# Patient Record
Sex: Female | Born: 1950
Health system: Southern US, Community
[De-identification: ages and names within clinical notes are randomized; demographics above are authoritative.]

## PROBLEM LIST (undated history)

## (undated) DIAGNOSIS — M4800 Spinal stenosis, site unspecified: Secondary | ICD-10-CM

## (undated) DIAGNOSIS — F419 Anxiety disorder, unspecified: Secondary | ICD-10-CM

## (undated) DIAGNOSIS — F4310 Post-traumatic stress disorder, unspecified: Secondary | ICD-10-CM

## (undated) DIAGNOSIS — Z9889 Other specified postprocedural states: Secondary | ICD-10-CM

## (undated) DIAGNOSIS — F909 Attention-deficit hyperactivity disorder, unspecified type: Secondary | ICD-10-CM

## (undated) DIAGNOSIS — F329 Major depressive disorder, single episode, unspecified: Secondary | ICD-10-CM

## (undated) DIAGNOSIS — E7800 Pure hypercholesterolemia, unspecified: Secondary | ICD-10-CM

## (undated) DIAGNOSIS — M1990 Unspecified osteoarthritis, unspecified site: Secondary | ICD-10-CM

## (undated) DIAGNOSIS — R112 Nausea with vomiting, unspecified: Secondary | ICD-10-CM

## (undated) DIAGNOSIS — F32A Depression, unspecified: Secondary | ICD-10-CM

## (undated) DIAGNOSIS — R7303 Prediabetes: Secondary | ICD-10-CM

## (undated) DIAGNOSIS — M797 Fibromyalgia: Secondary | ICD-10-CM

## (undated) HISTORY — PX: TONSILLECTOMY: SUR1361

## (undated) HISTORY — PX: CARPAL TUNNEL RELEASE: SHX101

## (undated) HISTORY — PX: PAROTIDECTOMY: SUR1003

## (undated) HISTORY — DX: Attention-deficit hyperactivity disorder, unspecified type: F90.9

## (undated) HISTORY — DX: Anxiety disorder, unspecified: F41.9

## (undated) HISTORY — DX: Depression, unspecified: F32.A

## (undated) HISTORY — PX: BACK SURGERY: SHX140

---
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
## 1993-05-31 HISTORY — PX: BREAST EXCISIONAL BIOPSY: SUR124

## 2001-03-17 ENCOUNTER — Other Ambulatory Visit: Admission: RE | Admit: 2001-03-17 | Discharge: 2001-03-17 | Payer: Self-pay | Admitting: *Deleted

## 2002-04-23 ENCOUNTER — Other Ambulatory Visit: Admission: RE | Admit: 2002-04-23 | Discharge: 2002-04-23 | Payer: Self-pay | Admitting: Obstetrics and Gynecology

## 2002-06-18 ENCOUNTER — Ambulatory Visit (HOSPITAL_COMMUNITY): Admission: RE | Admit: 2002-06-18 | Discharge: 2002-06-18 | Payer: Self-pay | Admitting: Pulmonary Disease

## 2003-01-20 ENCOUNTER — Ambulatory Visit: Admission: RE | Admit: 2003-01-20 | Discharge: 2003-01-20 | Payer: Self-pay | Admitting: Pulmonary Disease

## 2003-06-01 HISTORY — PX: COLONOSCOPY: SHX174

## 2003-12-24 ENCOUNTER — Encounter: Admission: RE | Admit: 2003-12-24 | Discharge: 2003-12-24 | Payer: Self-pay | Admitting: Obstetrics and Gynecology

## 2005-02-15 ENCOUNTER — Encounter: Admission: RE | Admit: 2005-02-15 | Discharge: 2005-02-15 | Payer: Self-pay | Admitting: Obstetrics and Gynecology

## 2005-08-11 ENCOUNTER — Encounter (INDEPENDENT_AMBULATORY_CARE_PROVIDER_SITE_OTHER): Payer: Self-pay | Admitting: Specialist

## 2005-08-11 ENCOUNTER — Ambulatory Visit (HOSPITAL_COMMUNITY): Admission: RE | Admit: 2005-08-11 | Discharge: 2005-08-11 | Payer: Self-pay | Admitting: *Deleted

## 2006-12-07 ENCOUNTER — Encounter: Admission: RE | Admit: 2006-12-07 | Discharge: 2006-12-07 | Payer: Self-pay | Admitting: Obstetrics and Gynecology

## 2008-08-27 ENCOUNTER — Encounter: Admission: RE | Admit: 2008-08-27 | Discharge: 2008-08-27 | Payer: Self-pay | Admitting: Obstetrics and Gynecology

## 2009-11-13 ENCOUNTER — Encounter: Admission: RE | Admit: 2009-11-13 | Discharge: 2009-11-13 | Payer: Self-pay | Admitting: Obstetrics and Gynecology

## 2010-10-16 NOTE — Op Note (Signed)
NAME:  ABIR, EROH NO.:  [id number]   MEDICAL RECORD NO.:  [id number]          PATIENT TYPE:  AMB   LOCATION:  ENDO                         FACILITY:  [facility]   PHYSICIAN:  Anselmo Rod, M.D.  DATE OF BIRTH:  05/15/1951   DATE OF PROCEDURE:  08/11/2005  DATE OF DISCHARGE:                                 OPERATIVE REPORT   PROCEDURE:  Colonoscopy with cold biopsies x6, snare polypectomy x3.   ENDOSCOPIST:  Anselmo Rod, M.D.   INSTRUMENT USED:  Olympus videocolonoscope.   INDICATIONS FOR PROCEDURE:  The patient is a 59 year old white female with a  history of rectal bleeding undergoing screening colonoscopy to rule out  colonic polyps, masses, etc.   PREPROCEDURE PREPARATION:  Informed consent was procured from the patient.  The patient was fasted for four hours prior to the procedure and prepped  with OsmoPrep pill the night of and the morning of the procedure.  The risks  and benefits of the procedure, including a 10% missed rate of cancer and  polyps, was discussed with the patient as well.   PREPROCEDURE PHYSICAL EXAMINATION:  VITAL SIGNS:  Stable.  NECK:  Supple.  CHEST:  Clear to auscultation.  S1 and S2 regular.  ABDOMEN:  Soft with normal bowel sounds.   DESCRIPTION OF PROCEDURE:  The patient was placed in the left lateral  decubitus position and sedated with 100 mcg of Fentanyl, 10 mg of Versed,  and received 12.5 mg of Phenergan.  Once the patient was adequately sedated  and maintained on low-flow oxygen and continuous cardiac monitoring, the  Olympus videocolonoscope was advanced from the rectum to the cecum and  terminal ileum.  Multiple rectal and rectosigmoid polyps were removed by  cold biopsies (x6).  Three of the polyps were snared (hot snare).  There was  evidence of sigmoid diverticulosis.  The transverse colon, right colon,  cecum, and terminal ileum appeared healthy and without lesions.   IMPRESSION:  1.  Multiple rectal and  rectosigmoid polyps removed both by snare      polypectomy and cold biopsies.  2.  Sigmoid diverticulosis.  3.  Normal transverse colon, right colon, cecum, and terminal ileum.      Appendiceal orifice and ileocecal valve were clearly visualized and      photographed.   RECOMMENDATIONS:  1.  Await pathology results.  2.  Repeat colonoscopy depending on pathology results.  3.  Avoid all nonsteroidals, including aspirin, for the next two weeks.  4.  Outpatient followup as need arises in the future.  5.  Brochures on diverticulosis and a high fiber diet have been given to the      patient for education.      Anselmo Rod, M.D.  Electronically Signed     JNM/MEDQ  D:  08/11/2005  T:  08/12/2005  Job:  [id number]   cc:   Sherry A. Rosalio Macadamia, M.D.  Fax: [phone]   Oneal Deputy. Juanetta Gosling, M.D.  Fax: [phone]

## 2010-10-16 NOTE — Procedures (Signed)
   NAME:  [name], [name] NO.:  [id number]   MEDICAL RECORD NO.:  [id number]                    PATIENT TYPE:   LOCATION:                                       FACILITY:   PHYSICIAN:  Edward L. Juanetta Gosling, M.D.             DATE OF BIRTH:   DATE OF PROCEDURE:  DATE OF DISCHARGE:                                    STRESS TEST   INDICATIONS FOR PROCEDURE:  The patient is undergoing graded exercise  testing because of chest pain.  She also has hyperlipidemia and a positive  family history.  There are no contraindications to graded exercise testing.   The patient exercised for six minutes on the Bruce protocol reaching and  sustaining 7.0 METS.  Her maximum recorded heart rate was 147 which is 88%  of her age predicted maximal heart rate.  She had no chest pain during  exercise and no other symptoms.  Blood pressure response to exercise was  normal.  There were no electrocardiographic changes suggestive of inducible  ischemia.   IMPRESSION:  1. Fair exercise tolerance.  2. Normal blood pressure response to exercise.  3. No evidence of inducible ischemia.  4. No symptoms during exercise.                                               Edward L. Juanetta Gosling, M.D.    ELH/MEDQ  D:  06/18/2002  T:  06/18/2002  Job:  [id number]

## 2012-06-02 ENCOUNTER — Other Ambulatory Visit: Payer: Self-pay | Admitting: Obstetrics and Gynecology

## 2012-06-02 DIAGNOSIS — Z1231 Encounter for screening mammogram for malignant neoplasm of breast: Secondary | ICD-10-CM

## 2012-06-30 ENCOUNTER — Ambulatory Visit
Admission: RE | Admit: 2012-06-30 | Discharge: 2012-06-30 | Disposition: A | Discharge status: Home / Self Care | Payer: Medicare Other | Source: Ambulatory Visit | Attending: Obstetrics and Gynecology | Admitting: Obstetrics and Gynecology

## 2012-06-30 DIAGNOSIS — Z1231 Encounter for screening mammogram for malignant neoplasm of breast: Secondary | ICD-10-CM

## 2012-09-21 ENCOUNTER — Other Ambulatory Visit (HOSPITAL_COMMUNITY): Payer: Self-pay | Admitting: Pulmonary Disease

## 2012-09-21 DIAGNOSIS — Z139 Encounter for screening, unspecified: Secondary | ICD-10-CM

## 2012-09-26 ENCOUNTER — Ambulatory Visit (HOSPITAL_COMMUNITY)
Admission: RE | Admit: 2012-09-26 | Discharge: 2012-09-26 | Disposition: A | Discharge status: Home / Self Care | Payer: Medicare Other | Source: Ambulatory Visit | Attending: Pulmonary Disease | Admitting: Pulmonary Disease

## 2012-09-26 DIAGNOSIS — Z13820 Encounter for screening for osteoporosis: Secondary | ICD-10-CM | POA: Insufficient documentation

## 2012-09-26 DIAGNOSIS — Z139 Encounter for screening, unspecified: Secondary | ICD-10-CM

## 2012-09-26 DIAGNOSIS — Z780 Asymptomatic menopausal state: Secondary | ICD-10-CM | POA: Insufficient documentation

## 2013-06-26 ENCOUNTER — Other Ambulatory Visit: Payer: Self-pay | Admitting: Orthopedic Surgery

## 2013-06-26 DIAGNOSIS — M545 Low back pain, unspecified: Secondary | ICD-10-CM

## 2013-07-04 ENCOUNTER — Ambulatory Visit
Admission: RE | Admit: 2013-07-04 | Discharge: 2013-07-04 | Disposition: A | Discharge status: Home / Self Care | Payer: Medicare Other | Source: Ambulatory Visit | Attending: Orthopedic Surgery | Admitting: Orthopedic Surgery

## 2013-07-04 DIAGNOSIS — M545 Low back pain, unspecified: Secondary | ICD-10-CM

## 2013-09-06 ENCOUNTER — Other Ambulatory Visit: Payer: Self-pay

## 2013-09-06 DIAGNOSIS — Z1231 Encounter for screening mammogram for malignant neoplasm of breast: Secondary | ICD-10-CM

## 2013-09-06 DIAGNOSIS — Z9889 Other specified postprocedural states: Secondary | ICD-10-CM

## 2013-09-19 ENCOUNTER — Encounter (INDEPENDENT_AMBULATORY_CARE_PROVIDER_SITE_OTHER): Payer: Self-pay

## 2013-09-19 ENCOUNTER — Other Ambulatory Visit: Payer: Self-pay

## 2013-09-19 ENCOUNTER — Ambulatory Visit
Admission: RE | Admit: 2013-09-19 | Discharge: 2013-09-19 | Disposition: A | Discharge status: Home / Self Care | Payer: Medicare Other | Source: Ambulatory Visit

## 2013-09-19 DIAGNOSIS — Z9889 Other specified postprocedural states: Secondary | ICD-10-CM

## 2013-09-19 DIAGNOSIS — Z1231 Encounter for screening mammogram for malignant neoplasm of breast: Secondary | ICD-10-CM

## 2013-09-21 ENCOUNTER — Other Ambulatory Visit: Payer: Self-pay | Admitting: Obstetrics and Gynecology

## 2013-09-21 DIAGNOSIS — R928 Other abnormal and inconclusive findings on diagnostic imaging of breast: Secondary | ICD-10-CM

## 2013-10-03 ENCOUNTER — Ambulatory Visit
Admission: RE | Admit: 2013-10-03 | Discharge: 2013-10-03 | Disposition: A | Discharge status: Home / Self Care | Payer: Medicare Other | Source: Ambulatory Visit | Attending: Obstetrics and Gynecology | Admitting: Obstetrics and Gynecology

## 2013-10-03 DIAGNOSIS — R928 Other abnormal and inconclusive findings on diagnostic imaging of breast: Secondary | ICD-10-CM

## 2014-11-18 ENCOUNTER — Other Ambulatory Visit: Payer: Self-pay

## 2014-11-18 DIAGNOSIS — Z1231 Encounter for screening mammogram for malignant neoplasm of breast: Secondary | ICD-10-CM

## 2014-12-18 ENCOUNTER — Ambulatory Visit
Admission: RE | Admit: 2014-12-18 | Discharge: 2014-12-18 | Disposition: A | Discharge status: Home / Self Care | Payer: Medicare Other | Source: Ambulatory Visit

## 2014-12-18 DIAGNOSIS — Z1231 Encounter for screening mammogram for malignant neoplasm of breast: Secondary | ICD-10-CM

## 2015-04-30 ENCOUNTER — Other Ambulatory Visit: Payer: Self-pay | Admitting: Physician Assistant

## 2015-06-04 DIAGNOSIS — R7309 Other abnormal glucose: Secondary | ICD-10-CM | POA: Diagnosis not present

## 2015-06-04 DIAGNOSIS — D72829 Elevated white blood cell count, unspecified: Secondary | ICD-10-CM | POA: Diagnosis not present

## 2015-06-04 DIAGNOSIS — R739 Hyperglycemia, unspecified: Secondary | ICD-10-CM | POA: Diagnosis not present

## 2015-06-04 DIAGNOSIS — E785 Hyperlipidemia, unspecified: Secondary | ICD-10-CM | POA: Diagnosis not present

## 2015-06-04 DIAGNOSIS — Z0000 Encounter for general adult medical examination without abnormal findings: Secondary | ICD-10-CM | POA: Diagnosis not present

## 2015-06-05 DIAGNOSIS — Z0000 Encounter for general adult medical examination without abnormal findings: Secondary | ICD-10-CM | POA: Diagnosis not present

## 2015-06-05 DIAGNOSIS — Z23 Encounter for immunization: Secondary | ICD-10-CM | POA: Diagnosis not present

## 2015-06-12 DIAGNOSIS — B079 Viral wart, unspecified: Secondary | ICD-10-CM | POA: Diagnosis not present

## 2015-06-16 DIAGNOSIS — R311 Benign essential microscopic hematuria: Secondary | ICD-10-CM | POA: Diagnosis not present

## 2015-06-16 DIAGNOSIS — N393 Stress incontinence (female) (male): Secondary | ICD-10-CM | POA: Diagnosis not present

## 2015-07-18 DIAGNOSIS — Z1211 Encounter for screening for malignant neoplasm of colon: Secondary | ICD-10-CM | POA: Diagnosis not present

## 2016-01-23 ENCOUNTER — Other Ambulatory Visit: Payer: Self-pay | Admitting: Obstetrics and Gynecology

## 2016-01-23 DIAGNOSIS — Z1231 Encounter for screening mammogram for malignant neoplasm of breast: Secondary | ICD-10-CM

## 2016-01-26 DIAGNOSIS — Z01419 Encounter for gynecological examination (general) (routine) without abnormal findings: Secondary | ICD-10-CM | POA: Diagnosis not present

## 2016-01-26 DIAGNOSIS — Z1212 Encounter for screening for malignant neoplasm of rectum: Secondary | ICD-10-CM | POA: Diagnosis not present

## 2016-01-27 DIAGNOSIS — E785 Hyperlipidemia, unspecified: Secondary | ICD-10-CM | POA: Diagnosis not present

## 2016-01-27 DIAGNOSIS — R739 Hyperglycemia, unspecified: Secondary | ICD-10-CM | POA: Diagnosis not present

## 2016-01-27 DIAGNOSIS — R7309 Other abnormal glucose: Secondary | ICD-10-CM | POA: Diagnosis not present

## 2016-01-29 DIAGNOSIS — M545 Low back pain: Secondary | ICD-10-CM | POA: Diagnosis not present

## 2016-01-29 DIAGNOSIS — E669 Obesity, unspecified: Secondary | ICD-10-CM | POA: Diagnosis not present

## 2016-01-29 DIAGNOSIS — R69 Illness, unspecified: Secondary | ICD-10-CM | POA: Diagnosis not present

## 2016-01-29 DIAGNOSIS — E785 Hyperlipidemia, unspecified: Secondary | ICD-10-CM | POA: Diagnosis not present

## 2016-02-05 DIAGNOSIS — N393 Stress incontinence (female) (male): Secondary | ICD-10-CM | POA: Diagnosis not present

## 2016-02-05 DIAGNOSIS — R311 Benign essential microscopic hematuria: Secondary | ICD-10-CM | POA: Diagnosis not present

## 2016-02-06 ENCOUNTER — Ambulatory Visit
Admission: RE | Admit: 2016-02-06 | Discharge: 2016-02-06 | Disposition: A | Discharge status: Home / Self Care | Payer: Medicare HMO | Source: Ambulatory Visit | Attending: Obstetrics and Gynecology | Admitting: Obstetrics and Gynecology

## 2016-02-06 DIAGNOSIS — Z1231 Encounter for screening mammogram for malignant neoplasm of breast: Secondary | ICD-10-CM

## 2016-02-13 ENCOUNTER — Other Ambulatory Visit (HOSPITAL_COMMUNITY): Payer: Self-pay | Admitting: Pulmonary Disease

## 2016-02-13 DIAGNOSIS — M545 Low back pain: Secondary | ICD-10-CM

## 2016-02-27 ENCOUNTER — Ambulatory Visit (HOSPITAL_COMMUNITY): Payer: Medicare HMO

## 2016-05-04 DIAGNOSIS — M545 Low back pain: Secondary | ICD-10-CM | POA: Diagnosis not present

## 2016-05-04 DIAGNOSIS — M47817 Spondylosis without myelopathy or radiculopathy, lumbosacral region: Secondary | ICD-10-CM | POA: Diagnosis not present

## 2016-09-09 DIAGNOSIS — F902 Attention-deficit hyperactivity disorder, combined type: Secondary | ICD-10-CM | POA: Diagnosis not present

## 2016-09-09 DIAGNOSIS — F411 Generalized anxiety disorder: Secondary | ICD-10-CM | POA: Diagnosis not present

## 2016-09-09 DIAGNOSIS — R69 Illness, unspecified: Secondary | ICD-10-CM | POA: Diagnosis not present

## 2016-10-22 DIAGNOSIS — B079 Viral wart, unspecified: Secondary | ICD-10-CM | POA: Diagnosis not present

## 2016-10-22 DIAGNOSIS — L723 Sebaceous cyst: Secondary | ICD-10-CM | POA: Diagnosis not present

## 2016-10-22 DIAGNOSIS — L709 Acne, unspecified: Secondary | ICD-10-CM | POA: Diagnosis not present

## 2016-10-22 DIAGNOSIS — D229 Melanocytic nevi, unspecified: Secondary | ICD-10-CM | POA: Diagnosis not present

## 2016-11-10 ENCOUNTER — Other Ambulatory Visit: Payer: Self-pay | Admitting: Physician Assistant

## 2016-11-10 DIAGNOSIS — L720 Epidermal cyst: Secondary | ICD-10-CM | POA: Diagnosis not present

## 2016-11-11 DIAGNOSIS — L720 Epidermal cyst: Secondary | ICD-10-CM | POA: Diagnosis not present

## 2016-11-24 DIAGNOSIS — L723 Sebaceous cyst: Secondary | ICD-10-CM | POA: Diagnosis not present

## 2017-03-15 DIAGNOSIS — R69 Illness, unspecified: Secondary | ICD-10-CM | POA: Diagnosis not present

## 2017-04-13 DIAGNOSIS — Z1212 Encounter for screening for malignant neoplasm of rectum: Secondary | ICD-10-CM | POA: Diagnosis not present

## 2017-04-13 DIAGNOSIS — Z01419 Encounter for gynecological examination (general) (routine) without abnormal findings: Secondary | ICD-10-CM | POA: Diagnosis not present

## 2017-04-13 DIAGNOSIS — Z124 Encounter for screening for malignant neoplasm of cervix: Secondary | ICD-10-CM | POA: Diagnosis not present

## 2017-04-28 ENCOUNTER — Other Ambulatory Visit: Payer: Self-pay | Admitting: Obstetrics and Gynecology

## 2017-04-28 DIAGNOSIS — Z1231 Encounter for screening mammogram for malignant neoplasm of breast: Secondary | ICD-10-CM

## 2017-05-12 DIAGNOSIS — F902 Attention-deficit hyperactivity disorder, combined type: Secondary | ICD-10-CM | POA: Diagnosis not present

## 2017-05-12 DIAGNOSIS — F411 Generalized anxiety disorder: Secondary | ICD-10-CM | POA: Diagnosis not present

## 2017-05-12 DIAGNOSIS — R69 Illness, unspecified: Secondary | ICD-10-CM | POA: Diagnosis not present

## 2017-05-27 ENCOUNTER — Ambulatory Visit
Admission: RE | Admit: 2017-05-27 | Discharge: 2017-05-27 | Disposition: A | Discharge status: Home / Self Care | Payer: Medicare HMO | Source: Ambulatory Visit | Attending: Obstetrics and Gynecology | Admitting: Obstetrics and Gynecology

## 2017-05-27 DIAGNOSIS — Z1231 Encounter for screening mammogram for malignant neoplasm of breast: Secondary | ICD-10-CM | POA: Diagnosis not present

## 2017-08-22 DIAGNOSIS — E669 Obesity, unspecified: Secondary | ICD-10-CM | POA: Diagnosis not present

## 2017-08-22 DIAGNOSIS — R7309 Other abnormal glucose: Secondary | ICD-10-CM | POA: Diagnosis not present

## 2017-08-22 DIAGNOSIS — Z0000 Encounter for general adult medical examination without abnormal findings: Secondary | ICD-10-CM | POA: Diagnosis not present

## 2017-08-22 DIAGNOSIS — R739 Hyperglycemia, unspecified: Secondary | ICD-10-CM | POA: Diagnosis not present

## 2017-08-22 DIAGNOSIS — E785 Hyperlipidemia, unspecified: Secondary | ICD-10-CM | POA: Diagnosis not present

## 2017-08-22 DIAGNOSIS — M545 Low back pain: Secondary | ICD-10-CM | POA: Diagnosis not present

## 2017-08-24 DIAGNOSIS — Z0000 Encounter for general adult medical examination without abnormal findings: Secondary | ICD-10-CM | POA: Diagnosis not present

## 2017-09-07 DIAGNOSIS — M545 Low back pain: Secondary | ICD-10-CM | POA: Diagnosis not present

## 2017-09-07 DIAGNOSIS — Z0000 Encounter for general adult medical examination without abnormal findings: Secondary | ICD-10-CM | POA: Diagnosis not present

## 2017-09-07 DIAGNOSIS — R69 Illness, unspecified: Secondary | ICD-10-CM | POA: Diagnosis not present

## 2017-09-07 DIAGNOSIS — R7309 Other abnormal glucose: Secondary | ICD-10-CM | POA: Diagnosis not present

## 2017-09-07 DIAGNOSIS — E785 Hyperlipidemia, unspecified: Secondary | ICD-10-CM | POA: Diagnosis not present

## 2017-09-07 DIAGNOSIS — E669 Obesity, unspecified: Secondary | ICD-10-CM | POA: Diagnosis not present

## 2017-12-08 DIAGNOSIS — F902 Attention-deficit hyperactivity disorder, combined type: Secondary | ICD-10-CM | POA: Diagnosis not present

## 2017-12-08 DIAGNOSIS — R69 Illness, unspecified: Secondary | ICD-10-CM | POA: Diagnosis not present

## 2017-12-08 DIAGNOSIS — F411 Generalized anxiety disorder: Secondary | ICD-10-CM | POA: Diagnosis not present

## 2017-12-21 DIAGNOSIS — E875 Hyperkalemia: Secondary | ICD-10-CM | POA: Diagnosis not present

## 2017-12-21 DIAGNOSIS — R69 Illness, unspecified: Secondary | ICD-10-CM | POA: Diagnosis not present

## 2017-12-21 DIAGNOSIS — R7309 Other abnormal glucose: Secondary | ICD-10-CM | POA: Diagnosis not present

## 2017-12-21 DIAGNOSIS — E669 Obesity, unspecified: Secondary | ICD-10-CM | POA: Diagnosis not present

## 2017-12-21 DIAGNOSIS — M545 Low back pain: Secondary | ICD-10-CM | POA: Diagnosis not present

## 2017-12-21 DIAGNOSIS — E785 Hyperlipidemia, unspecified: Secondary | ICD-10-CM | POA: Diagnosis not present

## 2018-04-20 DIAGNOSIS — R69 Illness, unspecified: Secondary | ICD-10-CM | POA: Diagnosis not present

## 2018-05-03 DIAGNOSIS — Z01419 Encounter for gynecological examination (general) (routine) without abnormal findings: Secondary | ICD-10-CM | POA: Diagnosis not present

## 2018-05-03 DIAGNOSIS — N952 Postmenopausal atrophic vaginitis: Secondary | ICD-10-CM | POA: Diagnosis not present

## 2018-05-03 DIAGNOSIS — Z1212 Encounter for screening for malignant neoplasm of rectum: Secondary | ICD-10-CM | POA: Diagnosis not present

## 2018-06-27 ENCOUNTER — Other Ambulatory Visit: Payer: Self-pay | Admitting: Obstetrics and Gynecology

## 2018-06-27 DIAGNOSIS — Z1231 Encounter for screening mammogram for malignant neoplasm of breast: Secondary | ICD-10-CM

## 2018-07-25 ENCOUNTER — Ambulatory Visit: Payer: Medicare HMO

## 2018-07-25 ENCOUNTER — Ambulatory Visit
Admission: RE | Admit: 2018-07-25 | Discharge: 2018-07-25 | Disposition: A | Discharge status: Home / Self Care | Payer: Medicare HMO | Source: Ambulatory Visit | Attending: Obstetrics and Gynecology | Admitting: Obstetrics and Gynecology

## 2018-07-25 DIAGNOSIS — Z1231 Encounter for screening mammogram for malignant neoplasm of breast: Secondary | ICD-10-CM | POA: Diagnosis not present

## 2018-08-24 DIAGNOSIS — R69 Illness, unspecified: Secondary | ICD-10-CM | POA: Diagnosis not present

## 2018-08-24 DIAGNOSIS — F902 Attention-deficit hyperactivity disorder, combined type: Secondary | ICD-10-CM | POA: Diagnosis not present

## 2018-08-24 DIAGNOSIS — F411 Generalized anxiety disorder: Secondary | ICD-10-CM | POA: Diagnosis not present

## 2018-12-29 ENCOUNTER — Other Ambulatory Visit: Payer: Self-pay | Admitting: Physician Assistant

## 2018-12-29 DIAGNOSIS — D485 Neoplasm of uncertain behavior of skin: Secondary | ICD-10-CM | POA: Diagnosis not present

## 2018-12-29 DIAGNOSIS — D229 Melanocytic nevi, unspecified: Secondary | ICD-10-CM

## 2018-12-29 DIAGNOSIS — D492 Neoplasm of unspecified behavior of bone, soft tissue, and skin: Secondary | ICD-10-CM | POA: Diagnosis not present

## 2018-12-29 DIAGNOSIS — D1809 Hemangioma of other sites: Secondary | ICD-10-CM | POA: Diagnosis not present

## 2018-12-29 DIAGNOSIS — L988 Other specified disorders of the skin and subcutaneous tissue: Secondary | ICD-10-CM | POA: Diagnosis not present

## 2018-12-29 DIAGNOSIS — L570 Actinic keratosis: Secondary | ICD-10-CM | POA: Diagnosis not present

## 2018-12-29 HISTORY — DX: Melanocytic nevi, unspecified: D22.9

## 2019-02-08 ENCOUNTER — Other Ambulatory Visit: Payer: Self-pay | Admitting: Physician Assistant

## 2019-02-08 DIAGNOSIS — D235 Other benign neoplasm of skin of trunk: Secondary | ICD-10-CM | POA: Diagnosis not present

## 2019-02-08 DIAGNOSIS — D225 Melanocytic nevi of trunk: Secondary | ICD-10-CM | POA: Diagnosis not present

## 2019-04-13 DIAGNOSIS — D229 Melanocytic nevi, unspecified: Secondary | ICD-10-CM | POA: Diagnosis not present

## 2019-04-13 DIAGNOSIS — D1801 Hemangioma of skin and subcutaneous tissue: Secondary | ICD-10-CM | POA: Diagnosis not present

## 2019-04-13 DIAGNOSIS — R69 Illness, unspecified: Secondary | ICD-10-CM | POA: Diagnosis not present

## 2019-05-08 DIAGNOSIS — E785 Hyperlipidemia, unspecified: Secondary | ICD-10-CM | POA: Diagnosis not present

## 2019-05-08 DIAGNOSIS — R7309 Other abnormal glucose: Secondary | ICD-10-CM | POA: Diagnosis not present

## 2019-05-08 DIAGNOSIS — E669 Obesity, unspecified: Secondary | ICD-10-CM | POA: Diagnosis not present

## 2019-05-08 DIAGNOSIS — M545 Low back pain: Secondary | ICD-10-CM | POA: Diagnosis not present

## 2019-05-08 DIAGNOSIS — R739 Hyperglycemia, unspecified: Secondary | ICD-10-CM | POA: Diagnosis not present

## 2019-05-14 DIAGNOSIS — Z0000 Encounter for general adult medical examination without abnormal findings: Secondary | ICD-10-CM | POA: Diagnosis not present

## 2019-05-18 DIAGNOSIS — H16223 Keratoconjunctivitis sicca, not specified as Sjogren's, bilateral: Secondary | ICD-10-CM | POA: Diagnosis not present

## 2019-05-22 DIAGNOSIS — E669 Obesity, unspecified: Secondary | ICD-10-CM | POA: Diagnosis not present

## 2019-05-22 DIAGNOSIS — R69 Illness, unspecified: Secondary | ICD-10-CM | POA: Diagnosis not present

## 2019-05-22 DIAGNOSIS — Z0000 Encounter for general adult medical examination without abnormal findings: Secondary | ICD-10-CM | POA: Diagnosis not present

## 2019-05-22 DIAGNOSIS — M545 Low back pain: Secondary | ICD-10-CM | POA: Diagnosis not present

## 2019-05-22 DIAGNOSIS — E785 Hyperlipidemia, unspecified: Secondary | ICD-10-CM | POA: Diagnosis not present

## 2019-05-22 DIAGNOSIS — R7309 Other abnormal glucose: Secondary | ICD-10-CM | POA: Diagnosis not present

## 2019-07-02 ENCOUNTER — Other Ambulatory Visit: Payer: Self-pay | Admitting: Obstetrics and Gynecology

## 2019-07-02 DIAGNOSIS — Z1231 Encounter for screening mammogram for malignant neoplasm of breast: Secondary | ICD-10-CM

## 2019-08-03 ENCOUNTER — Ambulatory Visit: Payer: Medicare HMO

## 2019-08-15 ENCOUNTER — Ambulatory Visit: Payer: Medicare HMO

## 2019-08-30 DIAGNOSIS — K5732 Diverticulitis of large intestine without perforation or abscess without bleeding: Secondary | ICD-10-CM

## 2019-08-30 HISTORY — DX: Diverticulitis of large intestine without perforation or abscess without bleeding: K57.32

## 2019-09-14 ENCOUNTER — Other Ambulatory Visit: Payer: Self-pay

## 2019-09-14 ENCOUNTER — Ambulatory Visit
Admission: RE | Admit: 2019-09-14 | Discharge: 2019-09-14 | Disposition: A | Discharge status: Home / Self Care | Payer: Medicare HMO | Source: Ambulatory Visit | Attending: Obstetrics and Gynecology | Admitting: Obstetrics and Gynecology

## 2019-09-14 DIAGNOSIS — Z1231 Encounter for screening mammogram for malignant neoplasm of breast: Secondary | ICD-10-CM

## 2019-09-19 ENCOUNTER — Ambulatory Visit
Admission: EM | Admit: 2019-09-19 | Discharge: 2019-09-19 | Disposition: A | Discharge status: Home / Self Care | Payer: Medicare HMO | Source: Home / Self Care

## 2019-09-19 ENCOUNTER — Emergency Department (HOSPITAL_COMMUNITY)
Admission: EM | Admit: 2019-09-19 | Discharge: 2019-09-19 | Disposition: A | Discharge status: Home / Self Care | Payer: Medicare HMO | Attending: Emergency Medicine | Admitting: Emergency Medicine

## 2019-09-19 ENCOUNTER — Emergency Department (HOSPITAL_COMMUNITY): Payer: Medicare HMO

## 2019-09-19 ENCOUNTER — Encounter (HOSPITAL_COMMUNITY): Payer: Self-pay

## 2019-09-19 ENCOUNTER — Other Ambulatory Visit: Payer: Self-pay

## 2019-09-19 DIAGNOSIS — Z79899 Other long term (current) drug therapy: Secondary | ICD-10-CM | POA: Insufficient documentation

## 2019-09-19 DIAGNOSIS — R42 Dizziness and giddiness: Secondary | ICD-10-CM | POA: Insufficient documentation

## 2019-09-19 DIAGNOSIS — R1031 Right lower quadrant pain: Secondary | ICD-10-CM | POA: Diagnosis not present

## 2019-09-19 DIAGNOSIS — K5792 Diverticulitis of intestine, part unspecified, without perforation or abscess without bleeding: Secondary | ICD-10-CM | POA: Diagnosis not present

## 2019-09-19 DIAGNOSIS — R109 Unspecified abdominal pain: Secondary | ICD-10-CM | POA: Diagnosis present

## 2019-09-19 DIAGNOSIS — R531 Weakness: Secondary | ICD-10-CM

## 2019-09-19 DIAGNOSIS — R61 Generalized hyperhidrosis: Secondary | ICD-10-CM

## 2019-09-19 HISTORY — DX: Spinal stenosis, site unspecified: M48.00

## 2019-09-19 HISTORY — DX: Unspecified osteoarthritis, unspecified site: M19.90

## 2019-09-19 HISTORY — DX: Pure hypercholesterolemia, unspecified: E78.00

## 2019-09-19 HISTORY — DX: Fibromyalgia: M79.7

## 2019-09-19 LAB — CBC
HCT: 47.5 % — ABNORMAL HIGH (ref 36.0–46.0)
Hemoglobin: 15.3 g/dL — ABNORMAL HIGH (ref 12.0–15.0)
MCH: 30.1 pg (ref 26.0–34.0)
MCHC: 32.2 g/dL (ref 30.0–36.0)
MCV: 93.3 fL (ref 80.0–100.0)
Platelets: 289 10*3/uL (ref 150–400)
RBC: 5.09 MIL/uL (ref 3.87–5.11)
RDW: 13.7 % (ref 11.5–15.5)
WBC: 17.8 10*3/uL — ABNORMAL HIGH (ref 4.0–10.5)
nRBC: 0 % (ref 0.0–0.2)

## 2019-09-19 LAB — URINALYSIS, ROUTINE W REFLEX MICROSCOPIC
Bilirubin Urine: NEGATIVE
Glucose, UA: NEGATIVE mg/dL
Ketones, ur: NEGATIVE mg/dL
Nitrite: NEGATIVE
Protein, ur: NEGATIVE mg/dL
Specific Gravity, Urine: 1.019 (ref 1.005–1.030)
pH: 6 (ref 5.0–8.0)

## 2019-09-19 LAB — COMPREHENSIVE METABOLIC PANEL
ALT: 41 U/L (ref 0–44)
AST: 45 U/L — ABNORMAL HIGH (ref 15–41)
Albumin: 3.7 g/dL (ref 3.5–5.0)
Alkaline Phosphatase: 109 U/L (ref 38–126)
Anion gap: 12 (ref 5–15)
BUN: 11 mg/dL (ref 8–23)
CO2: 20 mmol/L — ABNORMAL LOW (ref 22–32)
Calcium: 8.9 mg/dL (ref 8.9–10.3)
Chloride: 101 mmol/L (ref 98–111)
Creatinine, Ser: 0.78 mg/dL (ref 0.44–1.00)
GFR calc Af Amer: >60 mL/min (ref >60)
GFR calc non Af Amer: >60 mL/min (ref >60)
Glucose, Bld: 178 mg/dL — ABNORMAL HIGH (ref 70–99)
Potassium: 3.3 mmol/L — ABNORMAL LOW (ref 3.5–5.1)
Sodium: 133 mmol/L — ABNORMAL LOW (ref 135–145)
Total Bilirubin: 0.6 mg/dL (ref 0.3–1.2)
Total Protein: 7.7 g/dL (ref 6.5–8.1)

## 2019-09-19 LAB — POCT URINALYSIS DIP (MANUAL ENTRY)
Bilirubin, UA: NEGATIVE
Glucose, UA: NEGATIVE mg/dL
Nitrite, UA: NEGATIVE
Protein Ur, POC: NEGATIVE mg/dL
Spec Grav, UA: 1.01 (ref 1.010–1.025)
Urobilinogen, UA: 0.2 E.U./dL
pH, UA: 6 (ref 5.0–8.0)

## 2019-09-19 LAB — LIPASE, BLOOD: Lipase: 14 U/L (ref 11–51)

## 2019-09-19 MED ORDER — CIPROFLOXACIN HCL 500 MG PO TABS: 500.0000 mg | ORAL_TABLET | Freq: Two times a day (BID) | ORAL | 0 refills | Status: AC

## 2019-09-19 MED ORDER — CIPROFLOXACIN HCL 250 MG PO TABS
500.0000 mg | ORAL_TABLET | Freq: Once | ORAL | Status: AC
  Administered 2019-09-19: 500 mg via ORAL
  Filled 2019-09-19: qty 2

## 2019-09-19 MED ORDER — METRONIDAZOLE 500 MG PO TABS: 500.0000 mg | ORAL_TABLET | Freq: Three times a day (TID) | ORAL | 0 refills | Status: AC

## 2019-09-19 MED ORDER — POTASSIUM CHLORIDE CRYS ER 20 MEQ PO TBCR
20.0000 meq | EXTENDED_RELEASE_TABLET | Freq: Once | ORAL | Status: AC
  Administered 2019-09-19: 20 meq via ORAL
  Filled 2019-09-19: qty 1

## 2019-09-19 MED ORDER — IOHEXOL 300 MG/ML  SOLN
100.0000 mL | Freq: Once | INTRAMUSCULAR | Status: AC | PRN
  Administered 2019-09-19: 100 mL via INTRAVENOUS

## 2019-09-19 MED ORDER — METRONIDAZOLE 500 MG PO TABS
500.0000 mg | ORAL_TABLET | Freq: Once | ORAL | Status: AC
  Administered 2019-09-19: 500 mg via ORAL
  Filled 2019-09-19: qty 1

## 2019-09-19 MED ORDER — SODIUM CHLORIDE 0.9 % IV BOLUS
1000.0000 mL | Freq: Once | INTRAVENOUS | Status: AC
  Administered 2019-09-19: 1000 mL via INTRAVENOUS

## 2019-09-19 NOTE — Discharge Instructions (Signed)
As discussed, your CT scan showed diverticulitis.  You were given your first dose of antibiotics here in the ED.  I am sending you home with antibiotics.  Please take as prescribed and finish all antibiotics.  I have included the number of the GI doctor here in town.  Please call tomorrow to schedule an appointment for further evaluation.  Return to the ER for new or worsening symptoms.

## 2019-09-19 NOTE — ED Triage Notes (Addendum)
Pt presents to ED with complaints of lower right abd pain which has progressively gotten worse since Sunday. Pt states she had worked in the yard on Saturday and woke up Sunday with dizziness, body aches and weakness. Pt seen at Urgent Care and sent here for further workup.

## 2019-09-19 NOTE — ED Triage Notes (Signed)
Pt reports did  A lot of yard work sat and Sunday she c/o dizziness and body aches.  Pt says has also been having some lower abd pain and it has gotten worse.  Reports had fever 100.4 today and took tylenol approx 3 hours ago.  Pt diaphoretic during triage.  Pt says urine is cloudy and has strong odor.  Reports extreme weakness.

## 2019-09-19 NOTE — ED Notes (Signed)
Provider spoke with pt and instructed her to go to ER for further evaluation.  Report called to M. Doss RN

## 2019-09-19 NOTE — Discharge Instructions (Addendum)
Recommending further evaluation and management in the ED.  Cannot rule out appendicitis or other infectious cause of symptoms.  Patient aware and in agreement.  Will by private vehicle to [facility] ED.  Declines EMS transport.

## 2019-09-19 NOTE — ED Provider Notes (Signed)
[place]   [id number] 09/19/19 Arrival Time: [id number]  CC: ABDOMINAL DISCOMFORT  SUBJECTIVE:  Denise Foster is a 69 y.o. female who presents with complaint of abdominal discomfort that began 4 days ago.  Symptoms began after performing yard work.  Localizes pain to lower abdomen.  Describes as intermittent and "hurting" in character.  Has tried limiting potassium rich foods without relief.  Worse with urinating and turning in bed.  Denies similar symptoms in the past.  Complains of associated dizziness and weakness, and fever, tmax of 100.4 today.  Denies fever, chills, nausea, vomiting, chest pain, SOB, diarrhea, constipation, hematochezia, melena, dysuria, difficulty urinating, increased frequency or urgency, flank pain, loss of bowel or bladder function, vaginal discharge, vaginal odor, vaginal bleeding, dyspareunia, pelvic pain.     No LMP recorded. Patient is postmenopausal.  ROS: As per HPI.  All other pertinent ROS negative.     Past Medical History:  Diagnosis Date  . Fibromyalgia   . Hypercholesterolemia   . Osteoarthritis   . Spinal stenosis    Past Surgical History:  Procedure Laterality Date  . BREAST EXCISIONAL BIOPSY Left 1995   benign  . CARPAL TUNNEL RELEASE    . PAROTIDECTOMY    . TONSILLECTOMY     Allergies  Allergen Reactions  . Sulfa Antibiotics    No current facility-administered medications on file prior to encounter.   Current Outpatient Medications on File Prior to Encounter  Medication Sig Dispense Refill  . ALPRAZolam (XANAX) 0.5 MG tablet Take 0.5 mg by mouth at bedtime.    . DULoxetine (CYMBALTA) 60 MG capsule Take 60 mg by mouth daily.    . methylphenidate (RITALIN) 10 MG tablet Take 10 mg by mouth 2 (two) times daily.    . rosuvastatin (CRESTOR) 5 MG tablet Take 5 mg by mouth daily.     Social History   Socioeconomic History  . Marital status: Divorced    Spouse name: Not on file  . Number of children: Not on file  .  Years of education: Not on file  . Highest education level: Not on file  Occupational History  . Not on file  Tobacco Use  . Smoking status: Never Smoker  . Smokeless tobacco: Never Used  Substance and Sexual Activity  . Alcohol use: Never  . Drug use: Never  . Sexual activity: Not on file  Other Topics Concern  . Not on file  Social History Narrative  . Not on file   Social Determinants of Health   Financial Resource Strain:   . Difficulty of Paying Living Expenses:   Food Insecurity:   . Worried About Charity fundraiser in the Last Year:   . Arboriculturist in the Last Year:   Transportation Needs:   . Film/video editor (Medical):   Marland Kitchen Lack of Transportation (Non-Medical):   Physical Activity:   . Days of Exercise per Week:   . Minutes of Exercise per Session:   Stress:   . Feeling of Stress :   Social Connections:   . Frequency of Communication with Friends and Family:   . Frequency of Social Gatherings with Friends and Family:   . Attends Religious Services:   . Active Member of Clubs or Organizations:   . Attends Archivist Meetings:   Marland Kitchen Marital Status:   Intimate Partner Violence:   . Fear of Current or Ex-Partner:   . Emotionally Abused:   Marland Kitchen Physically Abused:   .  Sexually Abused:    Family History  Problem Relation Age of Onset  . Breast cancer Neg Hx      OBJECTIVE:  Vitals:   09/19/19 1840  BP: 127/86  Pulse: (!) 106  Resp: 18  Temp: 99 F (37.2 C)  TempSrc: Oral  SpO2: 95%  Weight: 185 lb (83.9 kg)  Height: 4\' 9"  (1.448 m)    General appearance: Alert; appears uncomfortable HEENT: NCAT.  Oropharynx clear.  Lungs: clear to auscultation bilaterally without adventitious breath sounds Heart: regular rate and rhythm.   Abdomen: soft, non-distended; normal active bowel sounds; TTP over RLQ and suprapubic region; tender at McBurney's point; negative Murphy's sign; negative rebound; mild guarding Back: no CVA  tenderness Extremities: no edema; symmetrical with no gross deformities Skin: diaphoretic Neurologic: normal gait Psychological: alert and cooperative; normal mood and affect  LABS: Results for orders placed or performed during the hospital encounter of 09/19/19 (from the past 24 hour(s))  POCT urinalysis dipstick     Status: Abnormal   Collection Time: 09/19/19  6:53 PM  Result Value Ref Range   Color, UA yellow yellow   Clarity, UA clear clear   Glucose, UA negative negative mg/dL   Bilirubin, UA negative negative   Ketones, POC UA trace (5) (A) negative mg/dL   Spec Grav, UA 1.010 1.010 - 1.025   Blood, UA moderate (A) negative   pH, UA 6.0 5.0 - 8.0   Protein Ur, POC negative negative mg/dL   Urobilinogen, UA 0.2 0.2 or 1.0 E.U./dL   Nitrite, UA Negative Negative   Leukocytes, UA Trace (A) Negative    ASSESSMENT & PLAN:  1. Right lower quadrant abdominal pain   2. Diaphoresis   3. Weakness   4. Dizziness and giddiness    Recommending further evaluation and management in the ED.  Cannot rule out appendicitis or other infectious cause of symptoms.  Patient aware and in agreement.  Will by private vehicle to [facility] ED.  Declines EMS transport.      Lestine Box, PA-C 09/19/19 1908

## 2019-09-19 NOTE — ED Provider Notes (Signed)
[facility] EMERGENCY DEPARTMENT Provider Note   CSN: [id number] Arrival date & time: 09/19/19  1914     History Chief Complaint  Patient presents with  . Abdominal Pain    Denise Foster is a 69 y.o. female with a past medical history significant for hyperlipidemia, fibromyalgia, and spinal stenosis who presents to the ED from urgent care due to right lower quadrant abdominal pain x4 days.  Patient notes symptoms began after performing yard work on Sunday.  Patient notes pain is worse with any movement or bending over.  Abdominal pain associated with dizziness, weakness, fever, and fatigue.  T-max 100.4 F today.  Denies associated nausea, vomiting, and diarrhea.  Patient describes dizziness as feeling off balance and worse with head movements.  She notes she has previously had this symptom before when her potassium was elevated.  She attributes her feelings of fatigue and dizziness to dehydration.  Denies previous abdominal operations.  She has taken Tylenol for symptoms with mild relief.  Denies chest pain and shortness of breath.  Denies vaginal and urinary symptoms.  She has not been sexually active in over 10 years and has no concerns for STDs at this time.  History obtained from patient and past medical records. No interpreter used during encounter.      Past Medical History:  Diagnosis Date  . Fibromyalgia   . Hypercholesterolemia   . Osteoarthritis   . Spinal stenosis     There are no problems to display for this patient.   Past Surgical History:  Procedure Laterality Date  . BREAST EXCISIONAL BIOPSY Left 1995   benign  . CARPAL TUNNEL RELEASE    . PAROTIDECTOMY    . TONSILLECTOMY       OB History   No obstetric history on file.     Family History  Problem Relation Age of Onset  . Breast cancer Neg Hx     Social History   Tobacco Use  . Smoking status: Never Smoker  . Smokeless tobacco: Never Used  Substance Use Topics  . Alcohol use: Never  .  Drug use: Never    Home Medications Prior to Admission medications   Medication Sig Start Date End Date Taking? Authorizing Provider  ALPRAZolam Duanne Moron) 0.5 MG tablet Take 0.5 mg by mouth at bedtime.    [provider]  ciprofloxacin (CIPRO) 500 MG tablet Take 1 tablet (500 mg total) by mouth every 12 (twelve) hours for 10 days. 09/19/19 09/29/19  Suzy Bouchard, PA-C  DULoxetine (CYMBALTA) 60 MG capsule Take 60 mg by mouth daily.    [provider]  methylphenidate (RITALIN) 10 MG tablet Take 10 mg by mouth 2 (two) times daily.    [provider]  metroNIDAZOLE (FLAGYL) 500 MG tablet Take 1 tablet (500 mg total) by mouth 3 (three) times daily for 10 days. 09/19/19 09/29/19  Suzy Bouchard, PA-C  rosuvastatin (CRESTOR) 5 MG tablet Take 5 mg by mouth daily.    [provider]    Allergies    Sulfa antibiotics  Review of Systems   Review of Systems  Constitutional: Positive for diaphoresis, fatigue and fever. Negative for chills.  Respiratory: Negative for shortness of breath.   Cardiovascular: Negative for chest pain.  Gastrointestinal: Positive for abdominal pain. Negative for diarrhea, nausea and vomiting.  Genitourinary: Negative for dysuria and vaginal discharge.  Musculoskeletal: Negative for back pain.  Neurological: Positive for dizziness. Negative for speech difficulty, numbness and headaches.  All other systems  reviewed and are negative.   Physical Exam Updated Vital Signs BP 125/72 (BP Location: Right Arm)   Pulse 87   Temp 98.1 F (36.7 C) (Oral)   Resp 19   Ht 4\' 9"  (1.448 m)   Wt 83.9 kg   SpO2 100%   BMI 40.03 kg/m   Physical Exam Vitals and nursing note reviewed.  Constitutional:      General: She is not in acute distress.    Appearance: She is not ill-appearing.  HENT:     Head: Normocephalic.  Eyes:     Extraocular Movements: Extraocular movements intact.     Pupils: Pupils are equal, round, and reactive to  light.  Cardiovascular:     Rate and Rhythm: Normal rate and regular rhythm.     Pulses: Normal pulses.     Heart sounds: Normal heart sounds. No murmur. No friction rub. No gallop.   Pulmonary:     Effort: Pulmonary effort is normal.     Breath sounds: Normal breath sounds.  Abdominal:     General: Abdomen is flat. Bowel sounds are normal. There is no distension.     Palpations: Abdomen is soft.     Tenderness: There is no abdominal tenderness. There is no guarding or rebound.     Comments: Negative CVA tenderness bilaterally.  Musculoskeletal:     Cervical back: Neck supple.     Comments: Able to move all 4 extremities without difficulty.  Skin:    General: Skin is warm and dry.  Neurological:     General: No focal deficit present.     Mental Status: She is alert.     Comments: Speech is clear, able to follow commands CN III-XII intact Normal strength in upper and lower extremities bilaterally including dorsiflexion and plantar flexion, strong and equal grip strength Sensation grossly intact throughout Moves extremities without ataxia, coordination intact No pronator drift   Psychiatric:        Mood and Affect: Mood normal.        Behavior: Behavior normal.     ED Results / Procedures / Treatments   Labs (all labs ordered are listed, but only abnormal results are displayed) Labs Reviewed  COMPREHENSIVE METABOLIC PANEL - Abnormal; Notable for the following components:      Result Value   Sodium 133 (*)    Potassium 3.3 (*)    CO2 20 (*)    Glucose, Bld 178 (*)    AST 45 (*)    All other components within normal limits  CBC - Abnormal; Notable for the following components:   WBC 17.8 (*)    Hemoglobin 15.3 (*)    HCT 47.5 (*)    All other components within normal limits  URINALYSIS, ROUTINE W REFLEX MICROSCOPIC - Abnormal; Notable for the following components:   APPearance CLOUDY (*)    Hgb urine dipstick LARGE (*)    Leukocytes,Ua SMALL (*)    Bacteria, UA RARE  (*)    All other components within normal limits  URINE CULTURE  LIPASE, BLOOD    EKG None  Radiology CT ABDOMEN PELVIS W CONTRAST  Result Date: 09/19/2019 CLINICAL DATA:  69 year female with right lower quadrant abdominal pain. EXAM: CT ABDOMEN AND PELVIS WITH CONTRAST TECHNIQUE: Multidetector CT imaging of the abdomen and pelvis was performed using the standard protocol following bolus administration of intravenous contrast. CONTRAST:  1106mL OMNIPAQUE IOHEXOL 300 MG/ML  SOLN COMPARISON:  None. FINDINGS: Lower chest: The visualized lung bases  are clear. No intra-abdominal free air or free fluid. Hepatobiliary: Subcentimeter left hepatic hypodense focus is too small to characterize. The liver is otherwise unremarkable. No intrahepatic biliary ductal dilatation. The gallbladder is unremarkable. Pancreas: Unremarkable. No pancreatic ductal dilatation or surrounding inflammatory changes. Spleen: Normal in size without focal abnormality. Adrenals/Urinary Tract: The adrenal glands are unremarkable. Punctate nonobstructing left renal interpolar calculus. There is no hydronephrosis on either side. The visualized ureters and urinary bladder appear unremarkable. Stomach/Bowel: There is extensive sigmoid diverticulosis. There is active inflammatory changes of the sigmoid colon compatible with acute diverticulitis. No diverticular abscess or perforation. There are scattered colonic diverticula. There is no bowel obstruction. The appendix is normal. Vascular/Lymphatic: Mild aortoiliac atherosclerotic disease. The IVC is unremarkable. No portal venous gas. Multiple mildly rounded top-normal lymph nodes in the lower abdomen and pelvis, likely reactive. Reproductive: Ill-defined posterior uterine fibroid measuring approximately 4 cm. Further evaluation with pelvic ultrasound on a nonemergent/outpatient basis recommended to exclude an endometrial abnormality. Other: None Musculoskeletal: Degenerative changes of  the spine with multilevel disc desiccation and vacuum phenomena. Grade 1 L4-L5 anterolisthesis. No acute osseous pathology. IMPRESSION: Sigmoid diverticulitis. No diverticular abscess or perforation. Electronically Signed   By: Anner Crete M.D.   On: 09/19/2019 21:01    Procedures Procedures (including critical care time)  Medications Ordered in ED Medications  ciprofloxacin (CIPRO) tablet 500 mg (has no administration in time range)  metroNIDAZOLE (FLAGYL) tablet 500 mg (has no administration in time range)  sodium chloride 0.9 % bolus 1,000 mL (1,000 mLs Intravenous New Bag/Given 09/19/19 2036)  iohexol (OMNIPAQUE) 300 MG/ML solution 100 mL (100 mLs Intravenous Contrast Given 09/19/19 2046)  potassium chloride SA (KLOR-CON) CR tablet 20 mEq (20 mEq Oral Given 09/19/19 2110)    ED Course  I have reviewed the triage vital signs and the nursing notes.  Pertinent labs & imaging results that were available during my care of the patient were reviewed by me and considered in my medical decision making (see chart for details).  Clinical Course as of Sep 18 2148  Wed Sep 19, 2019  2149 Chalmers Guest): SMALL [CA]  2149 Squamous Epithelial / LPF: 21-50 [CA]    Clinical Course User Index [CA] Suzy Bouchard, PA-C   MDM Rules/Calculators/A&P                     69 year old female presents to the ED from urgent care due to right lower quadrant abdominal pain associated with dizziness, diaphoresis, fatigue, and fever.  Patient sent from urgent care for further evaluation to rule out appendicitis versus infectious etiology.  Upon arrival, vitals all within normal limits.  Patient is afebrile, not tachycardic or hypoxic.  Patient in no acute distress and non-ill-appearing.  Physical exam reassuring.  Abdomen soft, nondistended, nontender.  Negative CVA tenderness bilaterally.  Normal neurological exam.  Will obtain routine labs and CT abdomen to rule out appendicitis versus infectious process  given patient's right lower quadrant abdominal tenderness at urgent care.  Patient deferred pain medication at this time.  CBC significant for leukocytosis at 17.8.  Lipase normal at 14.  Doubt pancreatitis.  CMP significant for hyponatremia at 133 and hypokalemia at 3.3.  Potassium repleted here in the ED. UA significant for large hematuria, small leukocytes with rare bacteria. Suspect contamination given 21-50 squamous cells. Urine culture pending. CT abdomen personally reviewed which demonstrates: IMPRESSION:  Sigmoid diverticulitis. No diverticular abscess or perforation.   Will give first dose of antibiotics here in the  ED. Shared decision making between patient and myself in regards to inpatient treatment vs outpatient treatment. Patient would like to go home with outpatient treatment. Patient able to tolerate PO here in the ED without difficulty. GI number given to patient at discharge. Advised patient to call tomorrow to schedule an appointment for further evaluation. Strict ED precautions discussed with patient. Patient states understanding and agrees to plan. Patient discharged home in no acute distress and stable vitals.  Discussed case with Dr. Sabra Heck who evaluated patient at bedside and agrees with assessment and plan.  Final Clinical Impression(s) / ED Diagnoses Final diagnoses:  Diverticulitis    Rx / DC Orders ED Discharge Orders         Ordered    ciprofloxacin (CIPRO) 500 MG tablet  Every 12 hours     09/19/19 2147    metroNIDAZOLE (FLAGYL) 500 MG tablet  3 times daily     09/19/19 2147           Karie Kirks 09/19/19 2210    Noemi Chapel, MD 09/19/19 2216

## 2019-09-20 ENCOUNTER — Telehealth: Payer: Self-pay

## 2019-09-20 NOTE — Telephone Encounter (Signed)
Pt was seen in the ER and recommended to follow up with [place]. Can we accept her as a new patient?

## 2019-09-20 NOTE — Telephone Encounter (Signed)
Ok to schedule new pt ov.  

## 2019-09-20 NOTE — Telephone Encounter (Signed)
Pt is aware of OV on Monday at 330

## 2019-09-21 LAB — URINE CULTURE: Culture: <10000 — AB

## 2019-09-23 NOTE — Progress Notes (Signed)
Referring Provider: [facility] Primary Care Physician:  Patient, No Pcp Per Primary Gastroenterologist:  Dr. Gala Romney  Chief Complaint  Patient presents with  . Abdominal Pain    Seen in UC/ER last week. RLQ. Much improved now. Diagnosed with diverticulitis    HPI:   Zanariah Dieckman is a 69 y.o. female presenting today at the request of [facility] for abdominal pain.   Reviewed ED note dated 09/19/2019.  Patient presented with right lower quadrant abdominal pain x4 days that was worsened with any sort of movement.  Associated dizziness, weakness, fever, and fatigue.  Denied nausea, vomiting, diarrhea.  Interestingly, patient did not have any abdominal pain on exam.  Labs with WBC 17.8, lipase normal at 14.  CMP significant for hyponatremia at 133 and hypokalemia at 3.3.  Potassium was repleted in the ED.  UA with large hematuria, small leukocytes with rare bacteria which was suspected to be contamination.  Urine culture with insignificant growth.  CT abdomen and pelvis with sigmoid diverticulitis without abscess or perforation.  She was started on ciprofloxacin 500 mg twice daily and Flagyl 500 mg 3 times daily x10 days and advised to follow-up with GI.  Today:  Still taking antibiotics. States she is feeling much better. No fever. Eating a bland diet. No nausea or vomiting. Had had diarrhea with antibiotics but this is improving. Usually, she has soft formed BMs. Stopped coffee. Will have 2 mushy stools a day. Abdominal pain has much improved. If she sits up for a long period of time, she will have some RLQ discomfort. Gets up to a 2/10. Was up to 40/10. Taking tylenol as needed. Reports eating a lot of seeds and nuts prior to onset. No blood in the stool or black stools. No prior diverticulitis. No significant upper GI issues.  Denies GERD or dysphagia.  First TCS was in [place] around 2005. Can't remember the doctors name or clinic but states she has the records at home and can  bring them back. Was told she had small polyps and diverticulosis.   Inquired about blood present in urine.  Patient denies overt hematuria.  States she has long history of microscopic hematuria and has followed with urology in the past without any significant explanation.  Denies dysuria.   Past Medical History:  Diagnosis Date  . ADHD   . Anxiety   . Fibromyalgia   . Hypercholesterolemia   . Osteoarthritis   . Spinal stenosis     Past Surgical History:  Procedure Laterality Date  . BREAST EXCISIONAL BIOPSY Left 1995   benign  . CARPAL TUNNEL RELEASE    . COLONOSCOPY  2005   Per patient, TCS in [facility] with diverticulosis and small polyps  . PAROTIDECTOMY    . TONSILLECTOMY      Current Outpatient Medications  Medication Sig Dispense Refill  . ALPRAZolam (XANAX) 0.5 MG tablet Take 0.5 mg by mouth at bedtime.    . ciprofloxacin (CIPRO) 500 MG tablet Take 1 tablet (500 mg total) by mouth every 12 (twelve) hours for 10 days. 20 tablet 0  . DULoxetine (CYMBALTA) 60 MG capsule Take 60 mg by mouth daily.    . methylphenidate (RITALIN) 10 MG tablet Take 10 mg by mouth 2 (two) times daily.    . metroNIDAZOLE (FLAGYL) 500 MG tablet Take 1 tablet (500 mg total) by mouth 3 (three) times daily for 10 days. 30 tablet 0  . rosuvastatin (CRESTOR) 5 MG tablet Take 5 mg by  mouth daily.     No current facility-administered medications for this visit.    Allergies as of 09/24/2019 - Review Complete 09/24/2019  Allergen Reaction Noted  . Sulfa antibiotics  09/19/2019    Family History  Problem Relation Age of Onset  . Breast cancer Neg Hx   . Colon cancer Neg Hx     Social History   Socioeconomic History  . Marital status: Divorced    Spouse name: Not on file  . Number of children: Not on file  . Years of education: Not on file  . Highest education level: Not on file  Occupational History  . Not on file  Tobacco Use  . Smoking status: Never Smoker  . Smokeless tobacco:  Never Used  Substance and Sexual Activity  . Alcohol use: Yes    Comment: rarely   . Drug use: Never  . Sexual activity: Not on file  Other Topics Concern  . Not on file  Social History Narrative  . Not on file   Social Determinants of Health   Financial Resource Strain:   . Difficulty of Paying Living Expenses:   Food Insecurity:   . Worried About Charity fundraiser in the Last Year:   . Arboriculturist in the Last Year:   Transportation Needs:   . Film/video editor (Medical):   Marland Kitchen Lack of Transportation (Non-Medical):   Physical Activity:   . Days of Exercise per Week:   . Minutes of Exercise per Session:   Stress:   . Feeling of Stress :   Social Connections:   . Frequency of Communication with Friends and Family:   . Frequency of Social Gatherings with Friends and Family:   . Attends Religious Services:   . Active Member of Clubs or Organizations:   . Attends Archivist Meetings:   Marland Kitchen Marital Status:   Intimate Partner Violence:   . Fear of Current or Ex-Partner:   . Emotionally Abused:   Marland Kitchen Physically Abused:   . Sexually Abused:     Review of Systems: Gen: Denies lightheadedness, dizziness, presyncope, syncope. CV: Denies chest pain or heart palpitations. Resp: Denies shortness of breath or cough. GI: See HPI GU : See HPI MS: Chronic diffuse joint pain related to arthritis. Derm: Denies rash Psych: Denies depression or anxiety. Heme: See HPI  Physical Exam: BP 131/77   Pulse 77   Temp (!) 97.1 F (36.2 C) (Oral)   Ht 4\' 9"  (1.448 m)   Wt 184 lb (83.5 kg)   BMI 39.82 kg/m  General:   Alert and oriented. Pleasant and cooperative. Well-nourished and well-developed.  Head:  Normocephalic and atraumatic. Eyes:  Without icterus, sclera clear and conjunctiva pink.  Ears:  Normal auditory acuity. Lungs:  Clear to auscultation bilaterally. No wheezes, rales, or rhonchi. No distress.  Heart:  S1, S2 present without murmurs appreciated.    Abdomen:  +BS, soft, non-tender and non-distended. No HSM noted. No guarding or rebound. No masses appreciated.  Rectal:  Deferred  Msk:  Symmetrical without gross deformities. Normal posture Extremities:  Without edema. Neurologic:  Alert and  oriented x4;  grossly normal neurologically. Skin:  Intact without significant lesions or rashes. Psych: Normal mood and affect.

## 2019-09-24 ENCOUNTER — Ambulatory Visit: Payer: Medicare HMO | Admitting: Gastroenterology

## 2019-09-24 ENCOUNTER — Other Ambulatory Visit: Payer: Self-pay

## 2019-09-24 ENCOUNTER — Encounter: Payer: Self-pay | Admitting: Gastroenterology

## 2019-09-24 DIAGNOSIS — K5732 Diverticulitis of large intestine without perforation or abscess without bleeding: Secondary | ICD-10-CM

## 2019-09-24 NOTE — Patient Instructions (Signed)
Continue taking your antibiotics until you have completed them.  I expect your very mild residual abdominal pain will continue to improve as you continue your antibiotics.  Should you have any return of abdominal pain after completing antibiotics, please let me know.  I recommend you continue a soft low fiber diet for the next couple of days.  After this, you may increase your diet as tolerated.  If you have any return of abdominal pain, decreased your diet back to a soft low fiber diet.  We will see you back in 4 weeks for follow-up and will discuss scheduling your colonoscopy at that time.  Aliene Altes, PA-C [facility]

## 2019-09-25 ENCOUNTER — Encounter: Payer: Self-pay | Admitting: Gastroenterology

## 2019-09-25 NOTE — Assessment & Plan Note (Addendum)
69 year old female with GI history of diverticulosis and colon polyps (per patient) with last colonoscopy in 2005 in [place] (can't remember location or provider).  Recently diagnosed with acute uncomplicated sigmoid diverticulitis on 09/19/2019 via CT.  She was started on ciprofloxacin 500 mg twice daily and Flagyl 500 mg 3 times daily x10 days.  Symptoms at the time of onset included right lower quadrant pain, fever dizziness, and weakness.  She is on her fourth day of antibiotics and has significantly improved.  Abdominal pain has essentially resolved other than very mild pain when sitting up for long periods of time.  Fevers resolved.  No nausea or vomiting.  Initially had diarrhea after starting antibiotics but this is improved with 2 mushy stools a day.  Currently following a bland diet.  Reports eating large amount of seeds in the months prior to onset of diverticulitis.  No history of constipation.  Abdominal exam is benign.  As patient has already had significant improvement in symptoms, will plan to complete her course of antibiotics. She will need follow-up colonoscopy, ideally in 6-8 weeks.  Discussed going ahead and scheduling colonoscopy in 8 weeks versus having follow-up in office in 4 weeks and scheduling at that time.  Patient prefers to follow-up in office in 4 weeks to ensure she is continuing to do well after completing antibiotics.  We will see her back in 4 weeks and schedule colonoscopy at that time. Recommended continuing soft low fiber diet for the next couple of days.  After this, she may increase diet as tolerated.  Monitor for return of abdominal pain and let us know if this occurs.  I also requested patient to bring prior colonoscopy records or let us know where her last colonoscopy was performed if she is able to remember.

## 2019-09-26 NOTE — Progress Notes (Signed)
No pcp per patient 

## 2019-10-11 ENCOUNTER — Encounter: Payer: Self-pay | Admitting: Physician Assistant

## 2019-10-11 ENCOUNTER — Other Ambulatory Visit: Payer: Self-pay

## 2019-10-11 ENCOUNTER — Ambulatory Visit: Payer: Medicare HMO | Admitting: Physician Assistant

## 2019-10-11 DIAGNOSIS — D229 Melanocytic nevi, unspecified: Secondary | ICD-10-CM

## 2019-10-11 DIAGNOSIS — D1801 Hemangioma of skin and subcutaneous tissue: Secondary | ICD-10-CM

## 2019-10-11 DIAGNOSIS — L578 Other skin changes due to chronic exposure to nonionizing radiation: Secondary | ICD-10-CM

## 2019-10-11 DIAGNOSIS — L814 Other melanin hyperpigmentation: Secondary | ICD-10-CM

## 2019-10-11 DIAGNOSIS — L821 Other seborrheic keratosis: Secondary | ICD-10-CM

## 2019-10-11 DIAGNOSIS — Z86018 Personal history of other benign neoplasm: Secondary | ICD-10-CM

## 2019-10-11 DIAGNOSIS — Z1283 Encounter for screening for malignant neoplasm of skin: Secondary | ICD-10-CM

## 2019-10-11 NOTE — Patient Instructions (Signed)
se

## 2019-10-11 NOTE — Progress Notes (Signed)
   Follow-Up Visit   Subjective  Denise Foster is a 69 y.o. female who presents for the following: Follow-up (6 month follow-up to recheck left side chest. Biopsy showed atypical intradermal melanmocytic proliferation and excision done February 08, 2019. Still looks great.).   The following portions of the chart were reviewed this encounter and updated as appropriate: Tobacco  Allergies  Meds  Problems  Med Hx  Surg Hx  Fam Hx      Objective  Well appearing patient in no apparent distress; mood and affect are within normal limits.  A full examination was performed including scalp, head, eyes, ears, nose, lips, neck, chest, axillae, abdomen, back, buttocks, bilateral upper extremities, bilateral lower extremities, hands, feet, fingers, toes, fingernails, and toenails. All findings within normal limits unless otherwise noted below.  Objective  Left Breast: Scar clear  Objective  Head - to Toe: All scars clear  Assessment & Plan    Lentigines - Scattered tan macules - Discussed due to sun exposure - Benign, observe - Call for any changes  Seborrheic Keratoses - Stuck-on, waxy, tan-brown papules and plaques  - Discussed benign etiology and prognosis. - Observe - Call for any changes  Melanocytic Nevi - Tan-brown and/or pink-flesh-colored symmetric macules and papules - Benign appearing on exam today - Observation - Call clinic for new or changing moles - Recommend daily use of broad spectrum spf 30+ sunscreen to sun-exposed areas.   Hemangiomas - Red papules - Discussed benign nature - Observe - Call for any changes  Actinic Damage - diffuse scaly erythematous macules with underlying dyspigmentation - Recommend daily broad spectrum sunscreen SPF 30+ to sun-exposed areas, reapply every 2 hours as needed.  - Call for new or changing lesions.  Skin cancer screening performed today.  No DN or NMSC    History of dysplastic nevus Left  Breast  observe  Screening exam for skin cancer Head - to Toe  Yearly skin exams

## 2019-10-25 NOTE — Progress Notes (Signed)
Primary Care Physician:  Celene Squibb, MD Primary GI Physician: Dr. Gala Romney  Chief Complaint  Patient presents with  . Diverticulitis    had diarrhea once (started Amoxicillin for oral surgery)    HPI:   Denise Foster is a 69 y.o. female presenting today for follow-up of diverticulitis.  Patient was diagnosed with acute uncomplicated sigmoid diverticulitis on 09/19/2019 via CT.  Treated with Cipro 500 mg twice daily and Flagyl 500 mg 3 times daily x10 days.  She was last seen in our office on 09/24/2019.  She was on day 4 of antibiotics and had significant improvement in abdominal pain and fever had resolved.  Her abdominal exam was benign.  She reported last colonoscopy in 2005 in [place] with colon polyps.  Cannot remember location of colonoscopy.  She was to complete her course of antibiotics and we will see her back to discuss scheduling colonoscopy.  Today: Completed antibiotics. Had oral surgery Wednesday. On Amoxicillin for 10 days. One episode of diarrhea and back to normal. Soft, formed BMs daily. No abdominal pain, N/V, or fever. No blood in the stool or melena. No GERD symptoms or dysphagia.   This was her first episode of diverticulitis. Can't find her last TCS report. Knows it was in [place] around 2005 with polyps.   BP elevated today. States she has had 5 doctors appointments this week and has been very busy.   Has chronic arthritis pain, especially in her knees. Receives cortisone shots. No NSAIDs since diverticulitis. Had been placed on Naproxen prior.  Referral  Past Medical History:  Diagnosis Date  . ADHD   . Anxiety   . Atypical mole 12/29/2018   atypical intradermal melanmocytic proliferation on left side chest - excision  . Depression   . Fibromyalgia   . Hypercholesterolemia   . Osteoarthritis   . Sigmoid diverticulitis 08/2019  . Spinal stenosis     Past Surgical History:  Procedure Laterality Date  . BREAST EXCISIONAL BIOPSY Left 1995   benign  . CARPAL TUNNEL RELEASE    . COLONOSCOPY  2005   Per patient, TCS in [facility] with diverticulosis and small polyps  . PAROTIDECTOMY    . TONSILLECTOMY      Current Outpatient Medications  Medication Sig Dispense Refill  . acetaminophen (TYLENOL) 500 MG tablet Take 500 mg by mouth in the morning, at noon, in the evening, and at bedtime.    . Alpha-Lipoic Acid 600 MG TABS Take by mouth 2 (two) times daily.    Marland Kitchen ALPRAZolam (XANAX) 0.5 MG tablet Take 0.5 mg by mouth at bedtime.    Marland Kitchen amoxicillin (AMOXIL) 875 MG tablet Take 875 mg by mouth 2 (two) times daily. x10 days    . busPIRone (BUSPAR) 15 MG tablet Take 15 mg by mouth 2 (two) times daily.    . cetirizine (ZYRTEC) 10 MG tablet Take 10 mg by mouth as needed for allergies.    . Cholecalciferol (VITAMIN D3 PO) Take by mouth daily.    . cyclobenzaprine (FLEXERIL) 10 MG tablet Take 10 mg by mouth 3 (three) times daily as needed.    . DULoxetine (CYMBALTA) 60 MG capsule Take 60 mg by mouth daily.    Marland Kitchen EPIPEN 2-PAK 0.3 MG/0.3ML SOAJ injection INJECT INTO THIGH ASTNEEDED FOR ALLERGICDREACTION.    Marland Kitchen ezetimibe (ZETIA) 10 MG tablet Take 10 mg by mouth daily.    Marland Kitchen MAGNESIUM PO Take by mouth daily.    . methylphenidate (RITALIN) 10 MG tablet Take 20  mg by mouth 2 (two) times daily.     . Omega-3 Fatty Acids (FISH OIL PO) Take by mouth daily.    . RESTASIS 0.05 % ophthalmic emulsion 1 drop 2 (two) times daily.    . rosuvastatin (CRESTOR) 5 MG tablet Take 5 mg by mouth daily.    Marland Kitchen zinc gluconate 50 MG tablet Take 50 mg by mouth daily.     No current facility-administered medications for this visit.    Allergies as of 10/26/2019 - Review Complete 10/26/2019  Allergen Reaction Noted  . Sulfa antibiotics  09/19/2019    Family History  Problem Relation Age of Onset  . Breast cancer Neg Hx   . Colon cancer Neg Hx     Social History   Socioeconomic History  . Marital status: Divorced    Spouse name: Not on file  . Number of  children: Not on file  . Years of education: Not on file  . Highest education level: Not on file  Occupational History  . Not on file  Tobacco Use  . Smoking status: Never Smoker  . Smokeless tobacco: Never Used  Substance and Sexual Activity  . Alcohol use: Yes    Comment: rarely   . Drug use: Never  . Sexual activity: Not on file  Other Topics Concern  . Not on file  Social History Narrative  . Not on file   Social Determinants of Health   Financial Resource Strain:   . Difficulty of Paying Living Expenses:   Food Insecurity:   . Worried About Charity fundraiser in the Last Year:   . Arboriculturist in the Last Year:   Transportation Needs:   . Film/video editor (Medical):   Marland Kitchen Lack of Transportation (Non-Medical):   Physical Activity:   . Days of Exercise per Week:   . Minutes of Exercise per Session:   Stress:   . Feeling of Stress :   Social Connections:   . Frequency of Communication with Friends and Family:   . Frequency of Social Gatherings with Friends and Family:   . Attends Religious Services:   . Active Member of Clubs or Organizations:   . Attends Archivist Meetings:   Marland Kitchen Marital Status:     Review of Systems: Gen: Denies fever, chills, cold or flulike symptoms, lightheadedness, dizziness, presyncope, syncope CV: Denies chest pain or palpitations Resp: Denies dyspnea or cough GI: See HPI Derm: Denies rash Psych: Chronic history of anxiety/depression. Medications working well.  Heme: See HPI  Physical Exam: BP (!) 153/91   Pulse 80   Temp (!) 97.3 F (36.3 C) (Temporal)   Ht 4\' 9"  (1.448 m)   Wt 185 lb 3.2 oz (84 kg)   BMI 40.08 kg/m  General:   Alert and oriented. No distress noted. Pleasant and cooperative.  Head:  Normocephalic and atraumatic. Eyes:  Conjuctiva clear without scleral icterus. Heart:  S1, S2 present without murmurs appreciated. Lungs:  Clear to auscultation bilaterally. No wheezes, rales, or rhonchi. No  distress.  Abdomen:  +BS, soft, non-tender and non-distended. No rebound or guarding. No HSM or masses noted. Msk:  Symmetrical without gross deformities. Normal posture. Extremities:  Without edema. Neurologic:  Alert and  oriented x4 Psych: Normal mood and affect.

## 2019-10-26 ENCOUNTER — Ambulatory Visit: Payer: Medicare HMO | Admitting: Gastroenterology

## 2019-10-26 ENCOUNTER — Other Ambulatory Visit: Payer: Self-pay

## 2019-10-26 ENCOUNTER — Encounter: Payer: Self-pay | Admitting: Gastroenterology

## 2019-10-26 VITALS — BP 153/91 | HR 80 | Temp 97.3°F | Ht <= 58 in | Wt 185.2 lb

## 2019-10-26 DIAGNOSIS — K5732 Diverticulitis of large intestine without perforation or abscess without bleeding: Secondary | ICD-10-CM | POA: Diagnosis not present

## 2019-10-26 NOTE — Patient Instructions (Signed)
We will get you scheduled for colonoscopy in the near future with Dr. Gala Romney.  We will plan to see back as Dr. Gala Romney recommends after colonoscopy.  Please continue to monitor for any return of abdominal pain and let me know if this occurs.  Aliene Altes, PA-C [facility]

## 2019-10-27 ENCOUNTER — Encounter: Payer: Self-pay | Admitting: Gastroenterology

## 2019-10-27 NOTE — Assessment & Plan Note (Addendum)
69 year old female with history of ADHD, anxiety/depression, arthritis, fibromyalgia, colon polyps (per patient) and recently with uncomplicated sigmoid diverticulitis in April 2021.  She was treated with Cipro and Flagyl x10 days and has had complete resolution of all symptoms.  She is doing very well at this time with no significant upper or lower GI symptoms.  Reports last colonoscopy was in 2005 in [place] with polyps.  Unfortunately, she cannot member exactly where or who performed the procedure, so I cannot request records.  She is overdue for surveillance colonoscopy and with recent diverticulitis, she will also need colonoscopy to follow-up on this.   Proceed with TCS with propofol with Dr. Gala Romney in the near future. The risks, benefits, and alternatives have been discussed in detail with patient. They have stated understanding and desire to proceed.  Propofol due to polypharmacy. Advise she continue to monitor for any return of abdominal pain with me know if this occurs. Follow-up as recommended at the time of procedure.

## 2019-11-29 ENCOUNTER — Telehealth: Payer: Self-pay

## 2019-11-29 NOTE — Telephone Encounter (Signed)
Fax received from patient's pharmacy for a prior authorization for Tretinoin Micro.

## 2019-11-29 NOTE — Telephone Encounter (Signed)
Prior authorization done through [facility] for patient's Tretinoin Micro.    Your information has been submitted to [place] Medicare Part D. [employer] will review the request and will issue a decision, typically within 1-3 days from your submission. You can check the updated outcome later by reopening this request.  If [employer] has not responded in 1-3 days or if you have any questions about your ePA request, please contact [place] Medicare Part D at [phone]. If you think there may be a problem with your PA request, use our live chat feature at the bottom right.

## 2019-12-03 NOTE — Telephone Encounter (Signed)
Prior Authorization denied for Tretinoin.

## 2019-12-06 ENCOUNTER — Telehealth: Payer: Self-pay | Admitting: *Deleted

## 2019-12-06 NOTE — Telephone Encounter (Signed)
LMOVM for pt to schedule TCS with propofol with RMR

## 2019-12-11 NOTE — Telephone Encounter (Signed)
LMOVM letter mailed 

## 2019-12-12 NOTE — Telephone Encounter (Signed)
Tried to call pt, no answer, LMOVM for return call.  

## 2019-12-12 NOTE — Telephone Encounter (Signed)
Pt called office, she will think about scheduling TCS and call office back tomorrow.

## 2019-12-12 NOTE — Telephone Encounter (Signed)
Pt called and said she has been out of town.  Can you call her back at [phone]?

## 2019-12-18 ENCOUNTER — Telehealth: Payer: Self-pay | Admitting: Internal Medicine

## 2019-12-18 NOTE — Telephone Encounter (Signed)
Called pt, she wants to wait till September to have TCS d/t other appts in August. She wants TCS w/RMR. She is aware we will call her back when September schedule is available.

## 2019-12-18 NOTE — Telephone Encounter (Signed)
[phone] patient called back to schedule her procedure

## 2019-12-18 NOTE — Telephone Encounter (Signed)
Spoke to pt, she wants to wait till September to have TCS d/t other appts in August. She wants TCS w/RMR. She is aware we will call her back when September schedule is available.

## 2020-01-03 ENCOUNTER — Telehealth: Payer: Self-pay | Admitting: *Deleted

## 2020-01-03 NOTE — Telephone Encounter (Signed)
Pt needs TCS with propofol with Dr. Gala Romney, ASA 3  LMOVM for pt

## 2020-01-07 ENCOUNTER — Encounter: Payer: Self-pay | Admitting: *Deleted

## 2020-01-07 NOTE — Telephone Encounter (Signed)
Patient returned call. Colonoscopy scheduled for 02/18/2020 at 7:30am. Patient aware will mail instructions and pre-op/covid test appt. Confirmed mailing address.

## 2020-02-14 ENCOUNTER — Encounter (HOSPITAL_COMMUNITY): Payer: Self-pay

## 2020-02-15 ENCOUNTER — Other Ambulatory Visit: Payer: Self-pay

## 2020-02-15 ENCOUNTER — Other Ambulatory Visit (HOSPITAL_COMMUNITY)
Admission: RE | Admit: 2020-02-15 | Discharge: 2020-02-15 | Disposition: A | Discharge status: Home / Self Care | Payer: Medicare HMO | Source: Ambulatory Visit | Attending: Internal Medicine | Admitting: Internal Medicine

## 2020-02-15 ENCOUNTER — Encounter (HOSPITAL_COMMUNITY)
Admission: RE | Admit: 2020-02-15 | Discharge: 2020-02-15 | Disposition: A | Discharge status: Home / Self Care | Payer: Medicare HMO | Source: Ambulatory Visit | Attending: Internal Medicine | Admitting: Internal Medicine

## 2020-02-15 DIAGNOSIS — Z20822 Contact with and (suspected) exposure to covid-19: Secondary | ICD-10-CM | POA: Diagnosis not present

## 2020-02-15 DIAGNOSIS — Z01812 Encounter for preprocedural laboratory examination: Secondary | ICD-10-CM | POA: Insufficient documentation

## 2020-02-15 HISTORY — DX: Other specified postprocedural states: Z98.890

## 2020-02-15 HISTORY — DX: Other specified postprocedural states: R11.2

## 2020-02-15 LAB — SARS CORONAVIRUS 2 (TAT 6-24 HRS): SARS Coronavirus 2: NEGATIVE

## 2020-02-18 ENCOUNTER — Encounter (HOSPITAL_COMMUNITY)
Admission: RE | Disposition: A | Discharge status: Home / Self Care | Payer: Self-pay | Source: Home / Self Care | Attending: Internal Medicine

## 2020-02-18 ENCOUNTER — Ambulatory Visit (HOSPITAL_COMMUNITY): Payer: Medicare HMO | Admitting: Anesthesiology

## 2020-02-18 ENCOUNTER — Ambulatory Visit (HOSPITAL_COMMUNITY)
Admission: RE | Admit: 2020-02-18 | Discharge: 2020-02-18 | Disposition: A | Discharge status: Home / Self Care | Payer: Medicare HMO | Attending: Internal Medicine | Admitting: Internal Medicine

## 2020-02-18 ENCOUNTER — Encounter (HOSPITAL_COMMUNITY): Payer: Self-pay | Admitting: Internal Medicine

## 2020-02-18 DIAGNOSIS — E7800 Pure hypercholesterolemia, unspecified: Secondary | ICD-10-CM | POA: Insufficient documentation

## 2020-02-18 DIAGNOSIS — Z79899 Other long term (current) drug therapy: Secondary | ICD-10-CM | POA: Diagnosis not present

## 2020-02-18 DIAGNOSIS — Z791 Long term (current) use of non-steroidal anti-inflammatories (NSAID): Secondary | ICD-10-CM | POA: Diagnosis not present

## 2020-02-18 DIAGNOSIS — Q438 Other specified congenital malformations of intestine: Secondary | ICD-10-CM | POA: Diagnosis not present

## 2020-02-18 DIAGNOSIS — M797 Fibromyalgia: Secondary | ICD-10-CM | POA: Insufficient documentation

## 2020-02-18 DIAGNOSIS — F909 Attention-deficit hyperactivity disorder, unspecified type: Secondary | ICD-10-CM | POA: Insufficient documentation

## 2020-02-18 DIAGNOSIS — Z6841 Body Mass Index (BMI) 40.0 and over, adult: Secondary | ICD-10-CM | POA: Insufficient documentation

## 2020-02-18 DIAGNOSIS — D123 Benign neoplasm of transverse colon: Secondary | ICD-10-CM | POA: Insufficient documentation

## 2020-02-18 DIAGNOSIS — K5730 Diverticulosis of large intestine without perforation or abscess without bleeding: Secondary | ICD-10-CM | POA: Insufficient documentation

## 2020-02-18 DIAGNOSIS — F419 Anxiety disorder, unspecified: Secondary | ICD-10-CM | POA: Diagnosis not present

## 2020-02-18 DIAGNOSIS — F329 Major depressive disorder, single episode, unspecified: Secondary | ICD-10-CM | POA: Insufficient documentation

## 2020-02-18 DIAGNOSIS — M1990 Unspecified osteoarthritis, unspecified site: Secondary | ICD-10-CM | POA: Insufficient documentation

## 2020-02-18 DIAGNOSIS — R933 Abnormal findings on diagnostic imaging of other parts of digestive tract: Secondary | ICD-10-CM | POA: Insufficient documentation

## 2020-02-18 HISTORY — PX: POLYPECTOMY: SHX5525

## 2020-02-18 HISTORY — PX: COLONOSCOPY WITH PROPOFOL: SHX5780

## 2020-02-18 SURGERY — COLONOSCOPY WITH PROPOFOL
Anesthesia: General

## 2020-02-18 MED ORDER — LACTATED RINGERS IV SOLN
Freq: Once | INTRAVENOUS | Status: AC
  Administered 2020-02-18: 1000 mL via INTRAVENOUS

## 2020-02-18 MED ORDER — STERILE WATER FOR IRRIGATION IR SOLN
Status: DC | PRN
  Administered 2020-02-18: 1.5 mL

## 2020-02-18 MED ORDER — PROPOFOL 10 MG/ML IV BOLUS
INTRAVENOUS | Status: AC
  Filled 2020-02-18: qty 120

## 2020-02-18 MED ORDER — CHLORHEXIDINE GLUCONATE CLOTH 2 % EX PADS: 6.0000 | MEDICATED_PAD | Freq: Once | CUTANEOUS | Status: DC

## 2020-02-18 MED ORDER — PROPOFOL 10 MG/ML IV BOLUS
INTRAVENOUS | Status: DC | PRN
  Administered 2020-02-18: 40 mg via INTRAVENOUS
  Administered 2020-02-18: 30 mg via INTRAVENOUS
  Administered 2020-02-18: 50 mg via INTRAVENOUS
  Administered 2020-02-18: 20 mg via INTRAVENOUS

## 2020-02-18 MED ORDER — PROPOFOL 10 MG/ML IV BOLUS
INTRAVENOUS | Status: AC
  Filled 2020-02-18: qty 60

## 2020-02-18 MED ORDER — LACTATED RINGERS IV SOLN: INTRAVENOUS | Status: DC | PRN

## 2020-02-18 MED ORDER — PROPOFOL 500 MG/50ML IV EMUL
INTRAVENOUS | Status: DC | PRN
  Administered 2020-02-18: 150 ug/kg/min via INTRAVENOUS

## 2020-02-18 NOTE — Op Note (Signed)
[facility] Patient Name: Denise Foster Procedure Date: 02/18/2020 7:08 AM MRN: [id number] Date of Birth: 13-Aug-1950 Attending MD: Norvel Richards , MD CSN: [id number] Age: 69 Admit Type: Outpatient Procedure:                Colonoscopy Indications:              Abnormal CT of the GI tract Providers:                Norvel Richards, MD, Charlsie Quest. Theda Sers RN, RN,                            Lambert Mody, Raphael Gibney, Technician Referring MD:              Medicines:                Propofol per Anesthesia Complications:            No immediate complications. Estimated Blood Loss:     Estimated blood loss was minimal. Procedure:                Pre-Anesthesia Assessment:                           - Prior to the procedure, a History and Physical                            was performed, and patient medications and                            allergies were reviewed. The patient's tolerance of                            previous anesthesia was also reviewed. The risks                            and benefits of the procedure and the sedation                            options and risks were discussed with the patient.                            All questions were answered, and informed consent                            was obtained. Prior Anticoagulants: The patient has                            taken no previous anticoagulant or antiplatelet                            agents. ASA Grade Assessment: II - A patient with                            mild systemic disease. After reviewing the risks  and benefits, the patient was deemed in                            satisfactory condition to undergo the procedure.                           After obtaining informed consent, the colonoscope                            was passed under direct vision. Throughout the                            procedure, the patient's blood pressure, pulse, and                             oxygen saturations were monitored continuously. The                            CF-[id number] ([id number]) scope was introduced through                            the anus and advanced to the the cecum, identified                            by appendiceal orifice and ileocecal valve. The                            colonoscopy was performed without difficulty. The                            patient tolerated the procedure well. The quality                            of the bowel preparation was adequate. Scope In: 7:35:04 AM Scope Out: 7:57:13 AM Scope Withdrawal Time: 0 hours 9 minutes 5 seconds  Total Procedure Duration: 0 hours 22 minutes 9 seconds  Findings:      The perianal and digital rectal examinations were normal. Redundant       colon. External abdominal pressure applied to reach the cecum.      Scattered diverticula were found in the sigmoid colon and descending       colon. 2 polyps found at the hepatic flexure. 3 mm and 8 mm?"sessile;       these polyps were removed with a cold snare. Resection and retrieval       were complete. Estimated blood loss was minimal. Multiple tiny       mamillation's in the rectum and rectosigmoid?"not manipulated      The exam was otherwise without abnormality on direct and retroflexion       views. Impression:               - Diverticulosis in the sigmoid colon and in the                            descending colon. Polyps removed as described                            (  smaller 1 not recovered)                           - The examination was otherwise normal on direct                            and retroflexion views. Redundant colon. Moderate Sedation:      Moderate (conscious) sedation was personally administered by an       anesthesia professional. The following parameters were monitored: oxygen       saturation, heart rate, blood pressure, respiratory rate, EKG, adequacy       of pulmonary ventilation, and response to  care. Recommendation:           - Patient has a contact number available for                            emergencies. The signs and symptoms of potential                            delayed complications were discussed with the                            patient. Return to normal activities tomorrow.                            Written discharge instructions were provided to the                            patient.                           - Advance diet as tolerated. Follow-up on                            pathology. Further recommendations to follow. Procedure Code(s):        --- Professional ---                           [phone], Colonoscopy, flexible; with removal of                            tumor(s), polyp(s), or other lesion(s) by snare                            technique Diagnosis Code(s):        --- Professional ---                           K57.30, Diverticulosis of large intestine without                            perforation or abscess without bleeding                           R93.3, Abnormal findings on diagnostic imaging of  other parts of digestive tract CPT copyright 2019 [employer]. All rights reserved. The codes documented in this report are preliminary and upon coder review may  be revised to meet current compliance requirements. Cristopher Estimable. Carrine Kroboth, MD Norvel Richards, MD 02/18/2020 8:13:55 AM This report has been signed electronically. Number of Addenda: 0

## 2020-02-18 NOTE — Transfer of Care (Signed)
Immediate Anesthesia Transfer of Care Note  Patient: Denise Foster  Procedure(s) Performed: COLONOSCOPY WITH PROPOFOL (N/A ) POLYPECTOMY  Patient Location: PACU  Anesthesia Type:General  Level of Consciousness: awake  Airway & Oxygen Therapy: Patient Spontanous Breathing  Post-op Assessment: Report given to RN  Post vital signs: Reviewed and stable  Last Vitals:  Vitals Value Taken Time  BP    Temp    Pulse 69 02/18/20 0805  Resp 12 02/18/20 0805  SpO2 89 % 02/18/20 0805  Vitals shown include unvalidated device data.  Last Pain:  Vitals:   02/18/20 0730  TempSrc:   PainSc: 0-No pain      Patients Stated Pain Goal: 9 (58/48/35 0757)  Complications: No complications documented.

## 2020-02-18 NOTE — Anesthesia Postprocedure Evaluation (Signed)
Anesthesia Post Note  Patient: Denise Foster  Procedure(s) Performed: COLONOSCOPY WITH PROPOFOL (N/A ) POLYPECTOMY  Patient location during evaluation: PACU Anesthesia Type: General Level of consciousness: awake and alert and oriented Pain management: pain level controlled Vital Signs Assessment: post-procedure vital signs reviewed and stable Respiratory status: spontaneous breathing Cardiovascular status: blood pressure returned to baseline and stable Postop Assessment: no apparent nausea or vomiting Anesthetic complications: no   No complications documented.   Last Vitals:  Vitals:   02/18/20 0646  BP: 133/80  Resp: 18  Temp: 36.7 C  SpO2: 99%    Last Pain:  Vitals:   02/18/20 0730  TempSrc:   PainSc: 0-No pain                 Dreden Rivere

## 2020-02-18 NOTE — H&P (Signed)
@LOGO @   Primary Care Physician:  Celene Squibb, MD Primary Gastroenterologist:  Dr. Gala Romney  Pre-Procedure History & Physical: HPI:  Denise Foster is a 69 y.o. female here for diagnostic colonoscopy.  Abnormal sigmoid colon consistent with diverticulitis on CT previously.  Last colonoscopy over 10 years ago question polyps elsewhere.  No symptoms at this time.  Here for diagnostic colonoscopy.  Past Medical History:  Diagnosis Date   ADHD    Anxiety    Atypical mole 12/29/2018   atypical intradermal melanmocytic proliferation on left side chest - excision   Depression    Fibromyalgia    Hypercholesterolemia    Osteoarthritis    PONV (postoperative nausea and vomiting)    Sigmoid diverticulitis 08/2019   Spinal stenosis     Past Surgical History:  Procedure Laterality Date   BREAST EXCISIONAL BIOPSY Left 1995   benign   CARPAL TUNNEL RELEASE     COLONOSCOPY  2005   Per patient, TCS in [place] with diverticulosis and small polyps   PAROTIDECTOMY     TONSILLECTOMY      Prior to Admission medications   Medication Sig Start Date End Date Taking? Authorizing Provider  acetaminophen (TYLENOL) 500 MG tablet Take 1,000 mg by mouth 2 (two) times daily as needed (pain.).    Yes [provider]  Alpha-Lipoic Acid 600 MG TABS Take 600 mg by mouth 2 (two) times daily.    Yes [provider]  ALPRAZolam Duanne Moron) 0.5 MG tablet Take 0.5 mg by mouth at bedtime.   Yes [provider]  busPIRone (BUSPAR) 15 MG tablet Take 15 mg by mouth daily as needed (anxiety.).    Yes [provider]  cetirizine (ZYRTEC) 10 MG tablet Take 10 mg by mouth at bedtime.    Yes [provider]  Cholecalciferol (VITAMIN D-3) 125 MCG (5000 UT) TABS Take 5,000 Units by mouth daily.   Yes [provider]  cyclobenzaprine (FLEXERIL) 10 MG tablet Take 10 mg by mouth daily as needed for muscle spasms.  05/14/19  Yes [provider]   diclofenac Sodium (VOLTAREN) 1 % GEL Apply 1 application topically daily as needed (knee pain.).    Yes [provider]  DULoxetine (CYMBALTA) 60 MG capsule Take 120 mg by mouth at bedtime.    Yes [provider]  ezetimibe (ZETIA) 10 MG tablet Take 10 mg by mouth at bedtime.  09/27/19  Yes [provider]  Magnesium 100 MG TABS Take 100 mg by mouth at bedtime.   Yes [provider]  methylphenidate (RITALIN) 20 MG tablet Take 20 mg by mouth in the morning and at bedtime.  12/20/19  Yes [provider]  Multiple Vitamins-Minerals (ZINC PO) Take 60 mg by mouth daily.   Yes [provider]  Omega-3 Fatty Acids (FISH OIL PO) Take 230 mg by mouth at bedtime.   Yes [provider]  Propylene Glycol (SYSTANE BALANCE) 0.6 % SOLN Place 1 drop into both eyes in the morning, at noon, and at bedtime.   Yes [provider]  RESTASIS 0.05 % ophthalmic emulsion Place 1 drop into both eyes 2 (two) times daily.  09/29/19  Yes [provider]  rosuvastatin (CRESTOR) 5 MG tablet Take 5 mg by mouth every evening.    Yes [provider]  Sodium Fluoride (CLINPRO 5000) 1.1 % PSTE Place 1 application onto teeth at bedtime.   Yes [provider]  EPIPEN 2-PAK 0.3 MG/0.3ML SOAJ injection Inject  0.3 mg into the muscle as needed for anaphylaxis ((bee stings)).  05/14/19   [provider]    Allergies as of 01/07/2020 - Review Complete 10/26/2019  Allergen Reaction Noted   Sulfa antibiotics  09/19/2019    Family History  Problem Relation Age of Onset   Breast cancer Neg Hx    Colon cancer Neg Hx     Social History   Socioeconomic History   Marital status: Divorced    Spouse name: Not on file   Number of children: Not on file   Years of education: Not on file   Highest education level: Not on file  Occupational History   Not on file  Tobacco Use   Smoking status: Never Smoker   Smokeless  tobacco: Never Used  Vaping Use   Vaping Use: Never used  Substance and Sexual Activity   Alcohol use: Yes    Comment: rarely    Drug use: Never   Sexual activity: Not on file  Other Topics Concern   Not on file  Social History Narrative   Not on file   Social Determinants of Health   Financial Resource Strain:    Difficulty of Paying Living Expenses: Not on file  Food Insecurity:    Worried About Running Out of Food in the Last Year: Not on file   Ran Out of Food in the Last Year: Not on file  Transportation Needs:    Lack of Transportation (Medical): Not on file   Lack of Transportation (Non-Medical): Not on file  Physical Activity:    Days of Exercise per Week: Not on file   Minutes of Exercise per Session: Not on file  Stress:    Feeling of Stress : Not on file  Social Connections:    Frequency of Communication with Friends and Family: Not on file   Frequency of Social Gatherings with Friends and Family: Not on file   Attends Religious Services: Not on file   Active Member of Clubs or Organizations: Not on file   Attends Archivist Meetings: Not on file   Marital Status: Not on file  Intimate Partner Violence:    Fear of Current or Ex-Partner: Not on file   Emotionally Abused: Not on file   Physically Abused: Not on file   Sexually Abused: Not on file    Review of Systems: See HPI, otherwise negative ROS  Physical Exam: BP 133/80    Temp 98.1 F (36.7 C) (Oral)    Resp 18    Ht 4\' 9"  (1.448 m)    Wt 83.9 kg    SpO2 99%    BMI 40.03 kg/m  General:   Alert,  Well-developed, well-nourished, pleasant and cooperative in NAD Neck:  Supple; no masses or thyromegaly. No significant cervical adenopathy. Lungs:  Clear throughout to auscultation.   No wheezes, crackles, or rhonchi. No acute distress. Heart:  Regular rate and rhythm; no murmurs, clicks, rubs,  or gallops. Abdomen: Non-distended, normal bowel sounds.  Soft and nontender  without appreciable mass or hepatosplenomegaly.  Pulses:  Normal pulses noted. Extremities:  Without clubbing or edema.  Impression/Plan: 69 year old lady with abnormal colon on CT earlier this year consistent with diverticulitis.  Clinically, recovered.  Here for diagnostic colonoscopy per plan.  The risks, benefits, limitations, alternatives and imponderables have been reviewed with the patient. Questions have been answered. All parties are agreeable.      Notice: This dictation was prepared with Dragon dictation along with smaller  Company secretary. Any transcriptional errors that result from this process are unintentional and may not be corrected upon review.

## 2020-02-18 NOTE — Anesthesia Preprocedure Evaluation (Addendum)
Anesthesia Evaluation  Patient identified by MRN, date of birth, ID band Patient awake    Reviewed: Allergy & Precautions, NPO status , Patient's Chart, lab work & pertinent test results  History of Anesthesia Complications (+) PONV, AWARENESS UNDER ANESTHESIA and history of anesthetic complications  Airway Mallampati: II  TM Distance: >3 FB Neck ROM: Full    Dental  (+) Implants Crowns :   Pulmonary neg pulmonary ROS,    Pulmonary exam normal breath sounds clear to auscultation       Cardiovascular METS (uses cane, chric vack pain, knee pain): Normal cardiovascular exam Rhythm:Regular Rate:Normal     Neuro/Psych PSYCHIATRIC DISORDERS Anxiety Depression  Neuromuscular disease    GI/Hepatic Neg liver ROS, Bowel prep,  Endo/Other  Morbid obesity  Renal/GU negative Renal ROS     Musculoskeletal  (+) Arthritis , Fibromyalgia -  Abdominal   Peds  (+) ADHD Hematology negative hematology ROS (+)   Anesthesia Other Findings   Reproductive/Obstetrics negative OB ROS                           Anesthesia Physical Anesthesia Plan  ASA: III  Anesthesia Plan: General   Post-op Pain Management:    Induction: Intravenous  PONV Risk Score and Plan: TIVA  Airway Management Planned: Nasal Cannula and Natural Airway  Additional Equipment:   Intra-op Plan:   Post-operative Plan:   Informed Consent: I have reviewed the patients History and Physical, chart, labs and discussed the procedure including the risks, benefits and alternatives for the proposed anesthesia with the patient or authorized representative who has indicated his/her understanding and acceptance.       Plan Discussed with: CRNA and Surgeon  Anesthesia Plan Comments:        Anesthesia Quick Evaluation

## 2020-02-18 NOTE — Discharge Instructions (Signed)
Colonoscopy Discharge Instructions  Read the instructions outlined below and refer to this sheet in the next few weeks. These discharge instructions provide you with general information on caring for yourself after you leave the hospital. Your doctor may also give you specific instructions. While your treatment has been planned according to the most current medical practices available, unavoidable complications occasionally occur. If you have any problems or questions after discharge, call Dr. Gala Romney at [phone]. ACTIVITY  You may resume your regular activity, but move at a slower pace for the next 24 hours.   Take frequent rest periods for the next 24 hours.   Walking will help get rid of the air and reduce the bloated feeling in your belly (abdomen).   No driving for 24 hours (because of the medicine (anesthesia) used during the test).    Do not sign any important legal documents or operate any machinery for 24 hours (because of the anesthesia used during the test).  NUTRITION  Drink plenty of fluids.   You may resume your normal diet as instructed by your doctor.   Begin with a light meal and progress to your normal diet. Heavy or fried foods are harder to digest and may make you feel sick to your stomach (nauseated).   Avoid alcoholic beverages for 24 hours or as instructed.  MEDICATIONS  You may resume your normal medications unless your doctor tells you otherwise.  WHAT YOU CAN EXPECT TODAY  Some feelings of bloating in the abdomen.   Passage of more gas than usual.   Spotting of blood in your stool or on the toilet paper.  IF YOU HAD POLYPS REMOVED DURING THE COLONOSCOPY:  No aspirin products for 7 days or as instructed.   No alcohol for 7 days or as instructed.   Eat a soft diet for the next 24 hours.  FINDING OUT THE RESULTS OF YOUR TEST Not all test results are available during your visit. If your test results are not back during the visit, make an appointment  with your caregiver to find out the results. Do not assume everything is normal if you have not heard from your caregiver or the medical facility. It is important for you to follow up on all of your test results.  SEEK IMMEDIATE MEDICAL ATTENTION IF:  You have more than a spotting of blood in your stool.   Your belly is swollen (abdominal distention).   You are nauseated or vomiting.   You have a temperature over 101.   You have abdominal pain or discomfort that is severe or gets worse throughout the day.    Colon polyp and diverticulosis information provided  Further recommendations to follow pending review of pathology report  At patient request, I called Carlis Stable at [phone] and reviewed results and recommendations    Diverticulosis  Diverticulosis is a condition that develops when small pouches (diverticula) form in the wall of the large intestine (colon). The colon is where water is absorbed and stool (feces) is formed. The pouches form when the inside layer of the colon pushes through weak spots in the outer layers of the colon. You may have a few pouches or many of them. The pouches usually do not cause problems unless they become inflamed or infected. When this happens, the condition is called diverticulitis. What are the causes? The cause of this condition is not known. What increases the risk? The following factors may make you more likely to develop this condition:  Being older  than age 20. Your risk for this condition increases with age. Diverticulosis is rare among people younger than age 71. By age 61, many people have it.  Eating a low-fiber diet.  Having frequent constipation.  Being overweight.  Not getting enough exercise.  Smoking.  Taking over-the-counter pain medicines, like aspirin and ibuprofen.  Having a family history of diverticulosis. What are the signs or symptoms? In most people, there are no symptoms of this condition. If you do have  symptoms, they may include:  Bloating.  Cramps in the abdomen.  Constipation or diarrhea.  Pain in the lower left side of the abdomen. How is this diagnosed? Because diverticulosis usually has no symptoms, it is most often diagnosed during an exam for other colon problems. The condition may be diagnosed by:  Using a flexible scope to examine the colon (colonoscopy).  Taking an X-ray of the colon after dye has been put into the colon (barium enema).  Having a CT scan. How is this treated? You may not need treatment for this condition. Your health care provider may recommend treatment to prevent problems. You may need treatment if you have symptoms or if you previously had diverticulitis. Treatment may include:  Eating a high-fiber diet.  Taking a fiber supplement.  Taking a live bacteria supplement (probiotic).  Taking medicine to relax your colon. Follow these instructions at home: Medicines  Take over-the-counter and prescription medicines only as told by your health care provider.  If told by your health care provider, take a fiber supplement or probiotic. Constipation prevention Your condition may cause constipation. To prevent or treat constipation, you may need to:  Drink enough fluid to keep your urine pale yellow.  Take over-the-counter or prescription medicines.  Eat foods that are high in fiber, such as beans, whole grains, and fresh fruits and vegetables.  Limit foods that are high in fat and processed sugars, such as fried or sweet foods.  General instructions  Try not to strain when you have a bowel movement.  Keep all follow-up visits as told by your health care provider. This is important. Contact a health care provider if you:  Have pain in your abdomen.  Have bloating.  Have cramps.  Have not had a bowel movement in 3 days. Get help right away if:  Your pain gets worse.  Your bloating becomes very bad.  You have a fever or chills, and  your symptoms suddenly get worse.  You vomit.  You have bowel movements that are bloody or black.  You have bleeding from your rectum. Summary  Diverticulosis is a condition that develops when small pouches (diverticula) form in the wall of the large intestine (colon).  You may have a few pouches or many of them.  This condition is most often diagnosed during an exam for other colon problems.  Treatment may include increasing the fiber in your diet, taking supplements, or taking medicines. This information is not intended to replace advice given to you by your health care provider. Make sure you discuss any questions you have with your health care provider. Document Revised: 12/14/2018 Document Reviewed: 12/14/2018 Elsevier Patient Education  [place].   Colon Polyps  Polyps are tissue growths inside the body. Polyps can grow in many places, including the large intestine (colon). A polyp may be a round bump or a mushroom-shaped growth. You could have one polyp or several. Most colon polyps are noncancerous (benign). However, some colon polyps can become cancerous over time. Finding  and removing the polyps early can help prevent this. What are the causes? The exact cause of colon polyps is not known. What increases the risk? You are more likely to develop this condition if you:  Have a family history of colon cancer or colon polyps.  Are older than 25 or older than 45 if you are African American.  Have inflammatory bowel disease, such as ulcerative colitis or Crohn's disease.  Have certain hereditary conditions, such as: ? Familial adenomatous polyposis. ? Lynch syndrome. ? Turcot syndrome. ? Peutz-Jeghers syndrome.  Are overweight.  Smoke cigarettes.  Do not get enough exercise.  Drink too much alcohol.  Eat a diet that is high in fat and red meat and low in fiber.  Had childhood cancer that was treated with abdominal radiation. What are the signs or  symptoms? Most polyps do not cause symptoms. If you have symptoms, they may include:  Blood coming from your rectum when having a bowel movement.  Blood in your stool. The stool may look dark red or black.  Abdominal pain.  A change in bowel habits, such as constipation or diarrhea. How is this diagnosed? This condition is diagnosed with a colonoscopy. This is a procedure in which a lighted, flexible scope is inserted into the anus and then passed into the colon to examine the area. Polyps are sometimes found when a colonoscopy is done as part of routine cancer screening tests. How is this treated? Treatment for this condition involves removing any polyps that are found. Most polyps can be removed during a colonoscopy. Those polyps will then be tested for cancer. Additional treatment may be needed depending on the results of testing. Follow these instructions at home: Lifestyle  Maintain a healthy weight, or lose weight if recommended by your health care provider.  Exercise every day or as told by your health care provider.  Do not use any products that contain nicotine or tobacco, such as cigarettes and e-cigarettes. If you need help quitting, ask your health care provider.  If you drink alcohol, limit how much you have: ? 0-1 drink a day for women. ? 0-2 drinks a day for men.  Be aware of how much alcohol is in your drink. In the [place] one drink equals one 12 oz bottle of beer (355 mL), one 5 oz glass of wine (148 mL), or one 1 oz shot of hard liquor (44 mL). Eating and drinking   Eat foods that are high in fiber, such as fruits, vegetables, and whole grains.  Eat foods that are high in calcium and vitamin D, such as milk, cheese, yogurt, eggs, liver, fish, and broccoli.  Limit foods that are high in fat, such as fried foods and desserts.  Limit the amount of red meat and processed meat you eat, such as hot dogs, sausage, bacon, and lunch meats. General instructions  Keep  all follow-up visits as told by your health care provider. This is important. ? This includes having regularly scheduled colonoscopies. ? Talk to your health care provider about when you need a colonoscopy. Contact a health care provider if:  You have new or worsening bleeding during a bowel movement.  You have new or increased blood in your stool.  You have a change in bowel habits.  You lose weight for no known reason. Summary  Polyps are tissue growths inside the body. Polyps can grow in many places, including the colon.  Most colon polyps are noncancerous (benign), but some can become cancerous  over time.  This condition is diagnosed with a colonoscopy.  Treatment for this condition involves removing any polyps that are found. Most polyps can be removed during a colonoscopy. This information is not intended to replace advice given to you by your health care provider. Make sure you discuss any questions you have with your health care provider. Document Revised: 09/01/2017 Document Reviewed: 09/01/2017 Elsevier Patient Education  [place].

## 2020-02-19 ENCOUNTER — Encounter: Payer: Self-pay | Admitting: Internal Medicine

## 2020-02-19 LAB — SURGICAL PATHOLOGY

## 2020-02-21 ENCOUNTER — Encounter (HOSPITAL_COMMUNITY): Payer: Self-pay | Admitting: Internal Medicine

## 2020-05-01 ENCOUNTER — Other Ambulatory Visit: Payer: Self-pay | Admitting: Sports Medicine

## 2020-05-01 DIAGNOSIS — M4310 Spondylolisthesis, site unspecified: Secondary | ICD-10-CM

## 2020-05-01 DIAGNOSIS — M4800 Spinal stenosis, site unspecified: Secondary | ICD-10-CM

## 2020-06-03 ENCOUNTER — Other Ambulatory Visit: Payer: Self-pay

## 2020-06-03 ENCOUNTER — Ambulatory Visit
Admission: RE | Admit: 2020-06-03 | Discharge: 2020-06-03 | Disposition: A | Discharge status: Home / Self Care | Payer: Medicare HMO | Source: Ambulatory Visit | Attending: Sports Medicine | Admitting: Sports Medicine

## 2020-06-03 DIAGNOSIS — M4800 Spinal stenosis, site unspecified: Secondary | ICD-10-CM

## 2020-06-03 DIAGNOSIS — M4310 Spondylolisthesis, site unspecified: Secondary | ICD-10-CM

## 2020-06-30 ENCOUNTER — Other Ambulatory Visit: Payer: Self-pay | Admitting: Neurosurgery

## 2020-07-28 ENCOUNTER — Other Ambulatory Visit
Admission: RE | Admit: 2020-07-28 | Discharge: 2020-07-28 | Disposition: A | Discharge status: Home / Self Care | Payer: Medicare HMO | Source: Ambulatory Visit | Attending: Neurosurgery | Admitting: Neurosurgery

## 2020-07-28 ENCOUNTER — Other Ambulatory Visit: Payer: Self-pay

## 2020-07-28 DIAGNOSIS — Z20822 Contact with and (suspected) exposure to covid-19: Secondary | ICD-10-CM | POA: Insufficient documentation

## 2020-07-28 DIAGNOSIS — Z01812 Encounter for preprocedural laboratory examination: Secondary | ICD-10-CM | POA: Diagnosis present

## 2020-07-28 LAB — SARS CORONAVIRUS 2 (TAT 6-24 HRS): SARS Coronavirus 2: NEGATIVE

## 2020-08-20 ENCOUNTER — Other Ambulatory Visit (HOSPITAL_COMMUNITY): Payer: Self-pay | Admitting: Internal Medicine

## 2020-08-20 DIAGNOSIS — Z13820 Encounter for screening for osteoporosis: Secondary | ICD-10-CM

## 2020-08-25 ENCOUNTER — Other Ambulatory Visit: Payer: Self-pay

## 2020-08-25 ENCOUNTER — Ambulatory Visit (HOSPITAL_COMMUNITY)
Admission: RE | Admit: 2020-08-25 | Discharge: 2020-08-25 | Disposition: A | Discharge status: Home / Self Care | Payer: Medicare HMO | Source: Ambulatory Visit | Attending: Internal Medicine | Admitting: Internal Medicine

## 2020-08-25 DIAGNOSIS — Z13820 Encounter for screening for osteoporosis: Secondary | ICD-10-CM | POA: Insufficient documentation

## 2020-08-25 DIAGNOSIS — Z780 Asymptomatic menopausal state: Secondary | ICD-10-CM | POA: Insufficient documentation

## 2020-08-25 DIAGNOSIS — E559 Vitamin D deficiency, unspecified: Secondary | ICD-10-CM | POA: Diagnosis not present

## 2020-08-29 ENCOUNTER — Encounter (HOSPITAL_COMMUNITY): Payer: Self-pay | Admitting: Neurosurgery

## 2020-08-29 ENCOUNTER — Other Ambulatory Visit
Admission: RE | Admit: 2020-08-29 | Discharge: 2020-08-29 | Disposition: A | Discharge status: Home / Self Care | Payer: Medicare HMO | Source: Ambulatory Visit | Attending: Neurosurgery | Admitting: Neurosurgery

## 2020-08-29 ENCOUNTER — Other Ambulatory Visit: Payer: Self-pay

## 2020-08-29 DIAGNOSIS — Z20822 Contact with and (suspected) exposure to covid-19: Secondary | ICD-10-CM | POA: Diagnosis not present

## 2020-08-29 DIAGNOSIS — Z01812 Encounter for preprocedural laboratory examination: Secondary | ICD-10-CM | POA: Diagnosis present

## 2020-08-29 NOTE — Progress Notes (Signed)
Denise Foster denies chest pain or shortness of breath. Patient was tested for Covid and has been in quarantine since that time.

## 2020-08-30 LAB — SARS CORONAVIRUS 2 (TAT 6-24 HRS): SARS Coronavirus 2: NEGATIVE

## 2020-08-31 NOTE — Anesthesia Preprocedure Evaluation (Addendum)
Anesthesia Evaluation  Patient identified by MRN, date of birth, ID band Patient awake    Reviewed: Allergy & Precautions, NPO status , Patient's Chart, lab work & pertinent test results  History of Anesthesia Complications (+) PONV  Airway Mallampati: II  TM Distance: >3 FB Neck ROM: Full    Dental  (+) Dental Advisory Given, Chipped   Pulmonary neg pulmonary ROS,  08/29/2020 SARS coronavirus NEG   breath sounds clear to auscultation       Cardiovascular negative cardio ROS   Rhythm:Regular Rate:Normal     Neuro/Psych PSYCHIATRIC DISORDERS (PTSD, ADHD) Anxiety Depression Chronic back pain    GI/Hepatic negative GI ROS, Neg liver ROS,   Endo/Other  Morbid obesity  Renal/GU negative Renal ROS     Musculoskeletal  (+) Arthritis , Fibromyalgia -  Abdominal (+) + obese,   Peds  Hematology negative hematology ROS (+)   Anesthesia Other Findings   Reproductive/Obstetrics                           Anesthesia Physical Anesthesia Plan  ASA: III  Anesthesia Plan: General   Post-op Pain Management:    Induction: Intravenous  PONV Risk Score and Plan: 4 or greater and Dexamethasone, Ondansetron and Diphenhydramine  Airway Management Planned: Oral ETT  Additional Equipment: None  Intra-op Plan:   Post-operative Plan: Extubation in OR  Informed Consent: I have reviewed the patients History and Physical, chart, labs and discussed the procedure including the risks, benefits and alternatives for the proposed anesthesia with the patient or authorized representative who has indicated his/her understanding and acceptance.     Dental advisory given  Plan Discussed with: CRNA and Surgeon  Anesthesia Plan Comments:        Anesthesia Quick Evaluation

## 2020-09-01 ENCOUNTER — Inpatient Hospital Stay (HOSPITAL_COMMUNITY): Payer: Medicare HMO

## 2020-09-01 ENCOUNTER — Other Ambulatory Visit: Payer: Self-pay

## 2020-09-01 ENCOUNTER — Inpatient Hospital Stay (HOSPITAL_COMMUNITY): Payer: Medicare HMO | Admitting: Certified Registered"

## 2020-09-01 ENCOUNTER — Encounter (HOSPITAL_COMMUNITY): Payer: Self-pay | Admitting: Neurosurgery

## 2020-09-01 ENCOUNTER — Encounter (HOSPITAL_COMMUNITY)
Admission: RE | Disposition: A | Discharge status: Home / Self Care | Payer: Self-pay | Source: Home / Self Care | Attending: Neurosurgery

## 2020-09-01 ENCOUNTER — Inpatient Hospital Stay (HOSPITAL_COMMUNITY)
Admission: RE | Admit: 2020-09-01 | Discharge: 2020-09-02 | DRG: 454 | Disposition: A | Discharge status: Home / Self Care | Payer: Medicare HMO | Attending: Neurosurgery | Admitting: Neurosurgery

## 2020-09-01 DIAGNOSIS — Z882 Allergy status to sulfonamides status: Secondary | ICD-10-CM | POA: Diagnosis not present

## 2020-09-01 DIAGNOSIS — M5116 Intervertebral disc disorders with radiculopathy, lumbar region: Secondary | ICD-10-CM | POA: Diagnosis present

## 2020-09-01 DIAGNOSIS — Z91030 Bee allergy status: Secondary | ICD-10-CM | POA: Diagnosis not present

## 2020-09-01 DIAGNOSIS — E7800 Pure hypercholesterolemia, unspecified: Secondary | ICD-10-CM | POA: Diagnosis present

## 2020-09-01 DIAGNOSIS — M4316 Spondylolisthesis, lumbar region: Secondary | ICD-10-CM | POA: Diagnosis present

## 2020-09-01 DIAGNOSIS — F32A Depression, unspecified: Secondary | ICD-10-CM | POA: Diagnosis present

## 2020-09-01 DIAGNOSIS — F419 Anxiety disorder, unspecified: Secondary | ICD-10-CM | POA: Diagnosis present

## 2020-09-01 DIAGNOSIS — Z6841 Body Mass Index (BMI) 40.0 and over, adult: Secondary | ICD-10-CM | POA: Diagnosis not present

## 2020-09-01 DIAGNOSIS — M4726 Other spondylosis with radiculopathy, lumbar region: Secondary | ICD-10-CM | POA: Diagnosis present

## 2020-09-01 DIAGNOSIS — M48062 Spinal stenosis, lumbar region with neurogenic claudication: Principal | ICD-10-CM | POA: Diagnosis present

## 2020-09-01 DIAGNOSIS — M4180 Other forms of scoliosis, site unspecified: Secondary | ICD-10-CM | POA: Diagnosis present

## 2020-09-01 DIAGNOSIS — F909 Attention-deficit hyperactivity disorder, unspecified type: Secondary | ICD-10-CM | POA: Diagnosis present

## 2020-09-01 DIAGNOSIS — Z419 Encounter for procedure for purposes other than remedying health state, unspecified: Secondary | ICD-10-CM

## 2020-09-01 HISTORY — DX: Prediabetes: R73.03

## 2020-09-01 HISTORY — DX: Post-traumatic stress disorder, unspecified: F43.10

## 2020-09-01 HISTORY — PX: SPINAL FUSION W/ LUQUE UNIT ROD: SHX2426

## 2020-09-01 LAB — CBC
HCT: 41.6 % (ref 36.0–46.0)
Hemoglobin: 13.3 g/dL (ref 12.0–15.0)
MCH: 30.3 pg (ref 26.0–34.0)
MCHC: 32 g/dL (ref 30.0–36.0)
MCV: 94.8 fL (ref 80.0–100.0)
Platelets: 359 10*3/uL (ref 150–400)
RBC: 4.39 MIL/uL (ref 3.87–5.11)
RDW: 13.5 % (ref 11.5–15.5)
WBC: 13 10*3/uL — ABNORMAL HIGH (ref 4.0–10.5)
nRBC: 0 % (ref 0.0–0.2)

## 2020-09-01 LAB — TYPE AND SCREEN
ABO/RH(D): AB NEG
Antibody Screen: NEGATIVE

## 2020-09-01 LAB — SURGICAL PCR SCREEN
MRSA, PCR: NEGATIVE
Staphylococcus aureus: NEGATIVE

## 2020-09-01 LAB — ABO/RH: ABO/RH(D): AB NEG

## 2020-09-01 SURGERY — POSTERIOR LUMBAR FUSION 1 LEVEL
Anesthesia: General | Site: Back

## 2020-09-01 MED ORDER — EPHEDRINE SULFATE-NACL 50-0.9 MG/10ML-% IV SOSY
PREFILLED_SYRINGE | INTRAVENOUS | Status: DC | PRN
  Administered 2020-09-01 (×3): 10 mg via INTRAVENOUS

## 2020-09-01 MED ORDER — OXYCODONE HCL 5 MG PO TABS
ORAL_TABLET | ORAL | Status: AC
  Filled 2020-09-01: qty 1

## 2020-09-01 MED ORDER — LIDOCAINE 2% (20 MG/ML) 5 ML SYRINGE
INTRAMUSCULAR | Status: AC
  Filled 2020-09-01: qty 5

## 2020-09-01 MED ORDER — SODIUM CHLORIDE 0.9 % IV SOLN: 250.0000 mL | INTRAVENOUS | Status: DC

## 2020-09-01 MED ORDER — CEFAZOLIN SODIUM-DEXTROSE 2-4 GM/100ML-% IV SOLN
2.0000 g | Freq: Three times a day (TID) | INTRAVENOUS | Status: AC
  Administered 2020-09-01 (×2): 2 g via INTRAVENOUS
  Filled 2020-09-01 (×2): qty 100

## 2020-09-01 MED ORDER — METHYLPHENIDATE HCL 5 MG PO TABS
20.0000 mg | ORAL_TABLET | Freq: Two times a day (BID) | ORAL | Status: DC
  Administered 2020-09-02: 20 mg via ORAL
  Filled 2020-09-01: qty 4

## 2020-09-01 MED ORDER — CHLORHEXIDINE GLUCONATE 0.12 % MT SOLN
15.0000 mL | Freq: Once | OROMUCOSAL | Status: AC
  Administered 2020-09-01: 15 mL via OROMUCOSAL
  Filled 2020-09-01: qty 15

## 2020-09-01 MED ORDER — OXYCODONE HCL 5 MG PO TABS
5.0000 mg | ORAL_TABLET | Freq: Once | ORAL | Status: DC | PRN
Start: 2020-09-01 — End: 2020-09-01

## 2020-09-01 MED ORDER — ONDANSETRON HCL 4 MG/2ML IJ SOLN
INTRAMUSCULAR | Status: DC | PRN
  Administered 2020-09-01: 4 mg via INTRAVENOUS

## 2020-09-01 MED ORDER — ACETAMINOPHEN 500 MG PO TABS
1000.0000 mg | ORAL_TABLET | Freq: Four times a day (QID) | ORAL | Status: DC
  Administered 2020-09-01 (×3): 1000 mg via ORAL
  Filled 2020-09-01 (×4): qty 2

## 2020-09-01 MED ORDER — DOCUSATE SODIUM 100 MG PO CAPS
100.0000 mg | ORAL_CAPSULE | Freq: Two times a day (BID) | ORAL | Status: DC
  Administered 2020-09-01 – 2020-09-02 (×3): 100 mg via ORAL
  Filled 2020-09-01 (×3): qty 1

## 2020-09-01 MED ORDER — MAGNESIUM 100 MG PO TABS: 100.0000 mg | ORAL_TABLET | Freq: Two times a day (BID) | ORAL | Status: DC

## 2020-09-01 MED ORDER — MEPERIDINE HCL 25 MG/ML IJ SOLN: 6.2500 mg | INTRAMUSCULAR | Status: DC | PRN

## 2020-09-01 MED ORDER — PHENYLEPHRINE HCL-NACL 10-0.9 MG/250ML-% IV SOLN
INTRAVENOUS | Status: DC | PRN
  Administered 2020-09-01: 25 ug/min via INTRAVENOUS

## 2020-09-01 MED ORDER — EPHEDRINE 5 MG/ML INJ
INTRAVENOUS | Status: AC
  Filled 2020-09-01: qty 10

## 2020-09-01 MED ORDER — BUSPIRONE HCL 15 MG PO TABS
15.0000 mg | ORAL_TABLET | Freq: Every day | ORAL | Status: DC | PRN
  Filled 2020-09-01: qty 1

## 2020-09-01 MED ORDER — MENTHOL 3 MG MT LOZG: 1.0000 | LOZENGE | OROMUCOSAL | Status: DC | PRN

## 2020-09-01 MED ORDER — EZETIMIBE 10 MG PO TABS
10.0000 mg | ORAL_TABLET | Freq: Every day | ORAL | Status: DC
  Administered 2020-09-01: 10 mg via ORAL
  Filled 2020-09-01: qty 1

## 2020-09-01 MED ORDER — ACETAMINOPHEN 325 MG PO TABS
650.0000 mg | ORAL_TABLET | ORAL | Status: DC | PRN
  Administered 2020-09-02: 650 mg via ORAL
  Filled 2020-09-01 (×2): qty 2

## 2020-09-01 MED ORDER — DIPHENHYDRAMINE HCL 50 MG/ML IJ SOLN
INTRAMUSCULAR | Status: DC | PRN
  Administered 2020-09-01: 10 mg via INTRAVENOUS

## 2020-09-01 MED ORDER — HYDROMORPHONE HCL 1 MG/ML IJ SOLN
INTRAMUSCULAR | Status: AC
  Filled 2020-09-01: qty 1

## 2020-09-01 MED ORDER — ROSUVASTATIN CALCIUM 5 MG PO TABS
5.0000 mg | ORAL_TABLET | Freq: Every day | ORAL | Status: DC
  Administered 2020-09-01: 5 mg via ORAL
  Filled 2020-09-01: qty 1

## 2020-09-01 MED ORDER — SODIUM CHLORIDE (PF) 0.9 % IJ SOLN
INTRAMUSCULAR | Status: AC
  Filled 2020-09-01: qty 10

## 2020-09-01 MED ORDER — MIDAZOLAM HCL 2 MG/2ML IJ SOLN
INTRAMUSCULAR | Status: AC
  Filled 2020-09-01: qty 2

## 2020-09-01 MED ORDER — BUPIVACAINE-EPINEPHRINE (PF) 0.5% -1:200000 IJ SOLN
INTRAMUSCULAR | Status: DC | PRN
  Administered 2020-09-01: 10 mL

## 2020-09-01 MED ORDER — DEXAMETHASONE SODIUM PHOSPHATE 10 MG/ML IJ SOLN
INTRAMUSCULAR | Status: DC | PRN
  Administered 2020-09-01: 10 mg via INTRAVENOUS

## 2020-09-01 MED ORDER — PROPOFOL 10 MG/ML IV BOLUS
INTRAVENOUS | Status: AC
  Filled 2020-09-01: qty 20

## 2020-09-01 MED ORDER — PROPOFOL 10 MG/ML IV BOLUS
INTRAVENOUS | Status: DC | PRN
  Administered 2020-09-01: 30 mg via INTRAVENOUS
  Administered 2020-09-01: 120 mg via INTRAVENOUS

## 2020-09-01 MED ORDER — GLYCOPYRROLATE PF 0.2 MG/ML IJ SOSY
PREFILLED_SYRINGE | INTRAMUSCULAR | Status: DC | PRN
  Administered 2020-09-01: 2 mg via INTRAVENOUS

## 2020-09-01 MED ORDER — CYCLOBENZAPRINE HCL 10 MG PO TABS
ORAL_TABLET | ORAL | Status: AC
  Filled 2020-09-01: qty 1

## 2020-09-01 MED ORDER — CHLORHEXIDINE GLUCONATE CLOTH 2 % EX PADS: 6.0000 | MEDICATED_PAD | Freq: Once | CUTANEOUS | Status: DC

## 2020-09-01 MED ORDER — LIDOCAINE 2% (20 MG/ML) 5 ML SYRINGE
INTRAMUSCULAR | Status: DC | PRN
  Administered 2020-09-01: 25 mg via INTRAVENOUS

## 2020-09-01 MED ORDER — MORPHINE SULFATE (PF) 4 MG/ML IV SOLN
4.0000 mg | INTRAVENOUS | Status: DC | PRN
  Administered 2020-09-01 – 2020-09-02 (×2): 4 mg via INTRAVENOUS
  Filled 2020-09-01 (×2): qty 1

## 2020-09-01 MED ORDER — LORATADINE 10 MG PO TABS
10.0000 mg | ORAL_TABLET | Freq: Every day | ORAL | Status: DC
  Administered 2020-09-01 – 2020-09-02 (×2): 10 mg via ORAL
  Filled 2020-09-01 (×2): qty 1

## 2020-09-01 MED ORDER — SCOPOLAMINE 1 MG/3DAYS TD PT72
MEDICATED_PATCH | TRANSDERMAL | Status: AC
  Administered 2020-09-01: 1.5 mg via TRANSDERMAL
  Filled 2020-09-01: qty 1

## 2020-09-01 MED ORDER — PHENYLEPHRINE 40 MCG/ML (10ML) SYRINGE FOR IV PUSH (FOR BLOOD PRESSURE SUPPORT)
PREFILLED_SYRINGE | INTRAVENOUS | Status: DC | PRN
  Administered 2020-09-01 (×4): 80 ug via INTRAVENOUS

## 2020-09-01 MED ORDER — CYCLOSPORINE 0.05 % OP EMUL
1.0000 [drp] | Freq: Two times a day (BID) | OPHTHALMIC | Status: DC
  Administered 2020-09-01 – 2020-09-02 (×2): 1 [drp] via OPHTHALMIC
  Filled 2020-09-01 (×2): qty 1

## 2020-09-01 MED ORDER — SODIUM CHLORIDE 0.9% FLUSH: 3.0000 mL | INTRAVENOUS | Status: DC | PRN

## 2020-09-01 MED ORDER — ROCURONIUM BROMIDE 10 MG/ML (PF) SYRINGE
PREFILLED_SYRINGE | INTRAVENOUS | Status: AC
  Filled 2020-09-01: qty 10

## 2020-09-01 MED ORDER — OXYCODONE HCL 5 MG PO TABS: 5.0000 mg | ORAL_TABLET | ORAL | Status: DC | PRN

## 2020-09-01 MED ORDER — ONDANSETRON HCL 4 MG/2ML IJ SOLN
INTRAMUSCULAR | Status: AC
  Filled 2020-09-01: qty 2

## 2020-09-01 MED ORDER — BACITRACIN ZINC 500 UNIT/GM EX OINT
TOPICAL_OINTMENT | CUTANEOUS | Status: AC
  Filled 2020-09-01: qty 28.35

## 2020-09-01 MED ORDER — BISACODYL 10 MG RE SUPP: 10.0000 mg | Freq: Every day | RECTAL | Status: DC | PRN

## 2020-09-01 MED ORDER — DIPHENHYDRAMINE HCL 50 MG/ML IJ SOLN
INTRAMUSCULAR | Status: AC
  Filled 2020-09-01: qty 1

## 2020-09-01 MED ORDER — 0.9 % SODIUM CHLORIDE (POUR BTL) OPTIME
TOPICAL | Status: DC | PRN
  Administered 2020-09-01: 1000 mL

## 2020-09-01 MED ORDER — ACETAMINOPHEN 500 MG PO TABS
1000.0000 mg | ORAL_TABLET | Freq: Once | ORAL | Status: AC
  Administered 2020-09-01: 1000 mg via ORAL
  Filled 2020-09-01: qty 2

## 2020-09-01 MED ORDER — ALBUMIN HUMAN 5 % IV SOLN: INTRAVENOUS | Status: DC | PRN

## 2020-09-01 MED ORDER — ORAL CARE MOUTH RINSE: 15.0000 mL | Freq: Once | OROMUCOSAL | Status: AC

## 2020-09-01 MED ORDER — CYCLOBENZAPRINE HCL 10 MG PO TABS
10.0000 mg | ORAL_TABLET | Freq: Three times a day (TID) | ORAL | Status: DC | PRN
  Administered 2020-09-01 – 2020-09-02 (×3): 10 mg via ORAL
  Filled 2020-09-01 (×2): qty 1

## 2020-09-01 MED ORDER — LACTATED RINGERS IV SOLN: INTRAVENOUS | Status: DC

## 2020-09-01 MED ORDER — CEFAZOLIN SODIUM-DEXTROSE 2-4 GM/100ML-% IV SOLN
2.0000 g | INTRAVENOUS | Status: AC
  Administered 2020-09-01: 2 g via INTRAVENOUS
  Filled 2020-09-01: qty 100

## 2020-09-01 MED ORDER — THROMBIN 5000 UNITS EX SOLR: OROMUCOSAL | Status: DC | PRN

## 2020-09-01 MED ORDER — POLYVINYL ALCOHOL 1.4 % OP SOLN
1.0000 [drp] | Freq: Four times a day (QID) | OPHTHALMIC | Status: DC
  Administered 2020-09-01 – 2020-09-02 (×3): 1 [drp] via OPHTHALMIC
  Filled 2020-09-01: qty 15

## 2020-09-01 MED ORDER — HYDROMORPHONE HCL 1 MG/ML IJ SOLN
0.2500 mg | INTRAMUSCULAR | Status: DC | PRN
  Administered 2020-09-01 (×3): 0.5 mg via INTRAVENOUS

## 2020-09-01 MED ORDER — ONDANSETRON HCL 4 MG/2ML IJ SOLN: 4.0000 mg | Freq: Four times a day (QID) | INTRAMUSCULAR | Status: DC | PRN

## 2020-09-01 MED ORDER — MIDAZOLAM HCL 2 MG/2ML IJ SOLN: 0.5000 mg | Freq: Once | INTRAMUSCULAR | Status: DC | PRN

## 2020-09-01 MED ORDER — SCOPOLAMINE 1 MG/3DAYS TD PT72
1.0000 | MEDICATED_PATCH | TRANSDERMAL | Status: DC
  Filled 2020-09-01: qty 1

## 2020-09-01 MED ORDER — SUGAMMADEX SODIUM 200 MG/2ML IV SOLN
INTRAVENOUS | Status: DC | PRN
  Administered 2020-09-01: 100 mg via INTRAVENOUS

## 2020-09-01 MED ORDER — OXYCODONE HCL 5 MG PO TABS
10.0000 mg | ORAL_TABLET | ORAL | Status: DC | PRN
  Administered 2020-09-01 – 2020-09-02 (×4): 10 mg via ORAL
  Filled 2020-09-01 (×4): qty 2

## 2020-09-01 MED ORDER — ONDANSETRON HCL 4 MG PO TABS: 4.0000 mg | ORAL_TABLET | Freq: Four times a day (QID) | ORAL | Status: DC | PRN

## 2020-09-01 MED ORDER — BUPIVACAINE-EPINEPHRINE 0.5% -1:200000 IJ SOLN
INTRAMUSCULAR | Status: AC
  Filled 2020-09-01: qty 1

## 2020-09-01 MED ORDER — ROCURONIUM BROMIDE 10 MG/ML (PF) SYRINGE
PREFILLED_SYRINGE | INTRAVENOUS | Status: DC | PRN
  Administered 2020-09-01: 60 mg via INTRAVENOUS

## 2020-09-01 MED ORDER — BUPIVACAINE LIPOSOME 1.3 % IJ SUSP
INTRAMUSCULAR | Status: DC | PRN
  Administered 2020-09-01: 20 mL

## 2020-09-01 MED ORDER — PHENOL 1.4 % MT LIQD: 1.0000 | OROMUCOSAL | Status: DC | PRN

## 2020-09-01 MED ORDER — DULOXETINE HCL 30 MG PO CPEP
120.0000 mg | ORAL_CAPSULE | Freq: Every day | ORAL | Status: DC
  Administered 2020-09-01: 120 mg via ORAL
  Filled 2020-09-01: qty 4

## 2020-09-01 MED ORDER — ACETAMINOPHEN 650 MG RE SUPP: 650.0000 mg | RECTAL | Status: DC | PRN

## 2020-09-01 MED ORDER — BUPIVACAINE LIPOSOME 1.3 % IJ SUSP
INTRAMUSCULAR | Status: AC
  Filled 2020-09-01: qty 20

## 2020-09-01 MED ORDER — SUFENTANIL CITRATE 50 MCG/ML IV SOLN
INTRAVENOUS | Status: AC
  Filled 2020-09-01: qty 1

## 2020-09-01 MED ORDER — VITAMIN D-3 125 MCG (5000 UT) PO TABS: 10000.0000 [IU] | ORAL_TABLET | Freq: Every day | ORAL | Status: DC

## 2020-09-01 MED ORDER — DEXAMETHASONE SODIUM PHOSPHATE 10 MG/ML IJ SOLN
INTRAMUSCULAR | Status: AC
  Filled 2020-09-01: qty 1

## 2020-09-01 MED ORDER — GABAPENTIN 300 MG PO CAPS
300.0000 mg | ORAL_CAPSULE | Freq: Every day | ORAL | Status: DC
  Administered 2020-09-01: 300 mg via ORAL
  Filled 2020-09-01: qty 1

## 2020-09-01 MED ORDER — PROMETHAZINE HCL 25 MG/ML IJ SOLN: 6.2500 mg | INTRAMUSCULAR | Status: DC | PRN

## 2020-09-01 MED ORDER — THROMBIN 5000 UNITS EX SOLR
CUTANEOUS | Status: AC
  Filled 2020-09-01: qty 5000

## 2020-09-01 MED ORDER — OXYCODONE HCL 5 MG/5ML PO SOLN
5.0000 mg | Freq: Once | ORAL | Status: DC | PRN
Start: 2020-09-01 — End: 2020-09-01

## 2020-09-01 MED ORDER — ZOLPIDEM TARTRATE 5 MG PO TABS: 5.0000 mg | ORAL_TABLET | Freq: Every evening | ORAL | Status: DC | PRN

## 2020-09-01 MED ORDER — SODIUM CHLORIDE 0.9% FLUSH
3.0000 mL | Freq: Two times a day (BID) | INTRAVENOUS | Status: DC
  Administered 2020-09-01: 3 mL via INTRAVENOUS

## 2020-09-01 MED ORDER — MIDAZOLAM HCL 5 MG/5ML IJ SOLN
INTRAMUSCULAR | Status: DC | PRN
  Administered 2020-09-01: 2 mg via INTRAVENOUS

## 2020-09-01 MED ORDER — SUFENTANIL CITRATE 50 MCG/ML IV SOLN
INTRAVENOUS | Status: DC | PRN
  Administered 2020-09-01: 10 ug via INTRAVENOUS
  Administered 2020-09-01: 5 ug via INTRAVENOUS
  Administered 2020-09-01: 15 ug via INTRAVENOUS

## 2020-09-01 MED ORDER — BACITRACIN ZINC 500 UNIT/GM EX OINT
TOPICAL_OINTMENT | CUTANEOUS | Status: DC | PRN
  Administered 2020-09-01: 1 via TOPICAL

## 2020-09-01 SURGICAL SUPPLY — 69 items
ADH SKN CLS APL DERMABOND .7 (GAUZE/BANDAGES/DRESSINGS)
APL SKNCLS STERI-STRIP NONHPOA (GAUZE/BANDAGES/DRESSINGS) ×1
BENZOIN TINCTURE PRP APPL 2/3 (GAUZE/BANDAGES/DRESSINGS) ×2 IMPLANT
BLADE CLIPPER SURG (BLADE) IMPLANT
BUR MATCHSTICK NEURO 3.0 LAGG (BURR) ×2 IMPLANT
BUR PRECISION FLUTE 6.0 (BURR) ×2 IMPLANT
CANISTER SUCT 3000ML PPV (MISCELLANEOUS) ×2 IMPLANT
CAP LOCK DLX THRD (Cap) ×4 IMPLANT
CARTRIDGE OIL MAESTRO DRILL (MISCELLANEOUS) ×1 IMPLANT
CNTNR URN SCR LID CUP LEK RST (MISCELLANEOUS) ×1 IMPLANT
CONT SPEC 4OZ STRL OR WHT (MISCELLANEOUS) ×2
COVER BACK TABLE 60X90IN (DRAPES) ×2 IMPLANT
COVER WAND RF STERILE (DRAPES) ×1 IMPLANT
DECANTER SPIKE VIAL GLASS SM (MISCELLANEOUS) ×2 IMPLANT
DERMABOND ADVANCED (GAUZE/BANDAGES/DRESSINGS)
DERMABOND ADVANCED .7 DNX12 (GAUZE/BANDAGES/DRESSINGS) IMPLANT
DIFFUSER DRILL AIR PNEUMATIC (MISCELLANEOUS) ×2 IMPLANT
DRAPE C-ARM 42X72 X-RAY (DRAPES) ×4 IMPLANT
DRAPE HALF SHEET 40X57 (DRAPES) ×2 IMPLANT
DRAPE LAPAROTOMY 100X72X124 (DRAPES) ×2 IMPLANT
DRAPE SURG 17X23 STRL (DRAPES) ×8 IMPLANT
DRSG OPSITE POSTOP 4X6 (GAUZE/BANDAGES/DRESSINGS) ×2 IMPLANT
ELECT BLADE 4.0 EZ CLEAN MEGAD (MISCELLANEOUS) ×2
ELECT REM PT RETURN 9FT ADLT (ELECTROSURGICAL) ×2
ELECTRODE BLDE 4.0 EZ CLN MEGD (MISCELLANEOUS) ×1 IMPLANT
ELECTRODE REM PT RTRN 9FT ADLT (ELECTROSURGICAL) ×1 IMPLANT
EVACUATOR 1/8 PVC DRAIN (DRAIN) IMPLANT
GAUZE 4X4 16PLY RFD (DISPOSABLE) ×1 IMPLANT
GLOVE BIO SURGEON STRL SZ8 (GLOVE) ×4 IMPLANT
GLOVE BIO SURGEON STRL SZ8.5 (GLOVE) ×4 IMPLANT
GLOVE EXAM NITRILE XL STR (GLOVE) IMPLANT
GLOVE SURG POLYISO LF SZ6 (GLOVE) ×6 IMPLANT
GLOVE SURG UNDER POLY LF SZ6.5 (GLOVE) ×5 IMPLANT
GLOVE SURG UNDER POLY LF SZ7 (GLOVE) ×1 IMPLANT
GLOVE SURG UNDER POLY LF SZ7.5 (GLOVE) ×1 IMPLANT
GOWN STRL REUS W/ TWL LRG LVL3 (GOWN DISPOSABLE) IMPLANT
GOWN STRL REUS W/ TWL XL LVL3 (GOWN DISPOSABLE) ×2 IMPLANT
GOWN STRL REUS W/TWL 2XL LVL3 (GOWN DISPOSABLE) IMPLANT
GOWN STRL REUS W/TWL LRG LVL3 (GOWN DISPOSABLE) ×8
GOWN STRL REUS W/TWL XL LVL3 (GOWN DISPOSABLE) ×4
HEMOSTAT POWDER KIT SURGIFOAM (HEMOSTASIS) ×2 IMPLANT
KIT BASIN OR (CUSTOM PROCEDURE TRAY) ×2 IMPLANT
KIT TURNOVER KIT B (KITS) ×2 IMPLANT
MILL MEDIUM DISP (BLADE) ×1 IMPLANT
NDL HYPO 21X1.5 SAFETY (NEEDLE) IMPLANT
NEEDLE HYPO 21X1.5 SAFETY (NEEDLE) ×2 IMPLANT
NEEDLE HYPO 22GX1.5 SAFETY (NEEDLE) ×2 IMPLANT
NS IRRIG 1000ML POUR BTL (IV SOLUTION) ×2 IMPLANT
OIL CARTRIDGE MAESTRO DRILL (MISCELLANEOUS) ×2
PACK LAMINECTOMY NEURO (CUSTOM PROCEDURE TRAY) ×2 IMPLANT
PAD ARMBOARD 7.5X6 YLW CONV (MISCELLANEOUS) ×8 IMPLANT
PATTIES SURGICAL .5 X1 (DISPOSABLE) IMPLANT
PUTTY DBM 5CC CALC GRAN ×2 IMPLANT
ROD CREO DLX CVD 6.35X40 (Rod) IMPLANT
ROD CURVED TI 6.35X40 (Rod) ×4 IMPLANT
SCREW PA CREO DLX 6.5X45 (Screw) ×4 IMPLANT
SPACER ALTERA 10X31 8-12MM-8 (Spacer) ×1 IMPLANT
SPONGE LAP 4X18 RFD (DISPOSABLE) IMPLANT
SPONGE NEURO XRAY DETECT 1X3 (DISPOSABLE) IMPLANT
SPONGE SURGIFOAM ABS GEL 100 (HEMOSTASIS) IMPLANT
STRIP CLOSURE SKIN 1/2X4 (GAUZE/BANDAGES/DRESSINGS) ×2 IMPLANT
SUT VIC AB 1 CT1 18XBRD ANBCTR (SUTURE) ×2 IMPLANT
SUT VIC AB 1 CT1 8-18 (SUTURE) ×4
SUT VIC AB 2-0 CP2 18 (SUTURE) ×4 IMPLANT
SYR 20ML LL LF (SYRINGE) ×1 IMPLANT
TOWEL GREEN STERILE (TOWEL DISPOSABLE) ×2 IMPLANT
TOWEL GREEN STERILE FF (TOWEL DISPOSABLE) ×2 IMPLANT
TRAY FOLEY MTR SLVR 16FR STAT (SET/KITS/TRAYS/PACK) ×2 IMPLANT
WATER STERILE IRR 1000ML POUR (IV SOLUTION) ×2 IMPLANT

## 2020-09-01 NOTE — Anesthesia Postprocedure Evaluation (Signed)
Anesthesia Post Note  Patient: Denise Foster  Procedure(s) Performed: POSTERIOR LUMBAR INTERBODY FUSION, INTERBODY PROSTHESIS, POSTERIOR INSTRUMENTATION, LUMBAR FOUR-FIVE (N/A Back)     Patient location during evaluation: PACU Anesthesia Type: General Level of consciousness: awake and alert, patient cooperative and oriented Pain control: pain improving. Vital Signs Assessment: post-procedure vital signs reviewed and stable Respiratory status: spontaneous breathing, nonlabored ventilation and respiratory function stable Cardiovascular status: blood pressure returned to baseline and stable Postop Assessment: no apparent nausea or vomiting Anesthetic complications: no   No complications documented.  Last Vitals:  Vitals:   09/01/20 1216 09/01/20 1246  BP: 134/74 130/77  Pulse: 81 84  Resp: 12 20  Temp: 36.5 C 36.6 C  SpO2: 94% 96%                 Harlei Lehrmann,E. Marvine Encalade

## 2020-09-01 NOTE — Evaluation (Signed)
Physical Therapy Evaluation Patient Details Name: Denise Foster MRN: [id number] DOB: 1950-07-19 Today's Date: 09/01/2020   History of Present Illness  The pt is a 70 yo female presenting 4/4 s/p bilateral L4-5 laminotomy/foraminotomies to decompress L4 and L5 nerve roots and L4-5 TLIF. PMH includes: anxiety, OA, fibromyalgia, and carpal tunnel release.    Clinical Impression  Pt in bed upon arrival of PT, agreeable to evaluation at this time. Prior to admission the pt was independent with mobility, ADLs, and IADLs with intermittent use of walking stick due to pain. The pt now presents with minor limitations in functional mobility, endurance, and dynamic stability due to above dx, and will continue to benefit from skilled PT to address these deficits. The pt was able to perform sit-stand transfers, gait, and stairs with minG for safety, but no physical assist given. The pt has x2 minor lateral LOB, but was able to self-correct without assist or UE support. The pt was educated on spinal precautions, progressive walking HEP, car transfers, and fall risk reduction at home and verbalized understanding. The pt will be safe to return home with assist of friends (already arranged by the pt) when medically clear. Will continue to benefit from skilled PT to progress functional endurance, strength, and dynamic stability as well as application of spinal precautions to mobility.      Follow Up Recommendations Home health PT;Supervision for mobility/OOB    Equipment Recommendations  None recommended by PT    Recommendations for Other Services       Precautions / Restrictions Precautions Precautions: Back Precaution Booklet Issued: Yes (comment) Precaution Comments: pt able to verbalize 2/3 Required Braces or Orthoses: Spinal Brace Spinal Brace: Lumbar corset;Applied in sitting position Restrictions Weight Bearing Restrictions: No Other Position/Activity Restrictions: Brace not currently in  room, pt and friend in room report they (hospital) are supposed to be getting her a new one that fits better than the one she has at home      Mobility  Bed Mobility Overal bed mobility: Needs Assistance Bed Mobility: Rolling;Sidelying to Sit Rolling: Min guard Sidelying to sit: Min guard       General bed mobility comments: pt OOB in recliner at start and end of session    Transfers Overall transfer level: Needs assistance Equipment used: None Transfers: Sit to/from Stand Sit to Stand: Min guard         General transfer comment: minG for safety, no physical assist given, no evidence of instability with initial stand  Ambulation/Gait Ambulation/Gait assistance: Min guard Gait Distance (Feet): 300 Feet Assistive device: None Gait Pattern/deviations: Step-through pattern;Decreased stride length;Staggering right Gait velocity: 0.5 m/s Gait velocity interpretation: <1.8 ft/sec, indicate of risk for recurrent falls General Gait Details: generally WFL, slowed gait but steady without UE support. x2 minor LOB and staggering steps to R, but pt able to self-correct and recover without UE support.  Stairs Stairs: Yes Stairs assistance: Supervision Stair Management: One rail Right;Step to pattern;Forwards Number of Stairs: 2 General stair comments: step-to pattern with single UE support. slow but steady  Wheelchair Mobility    Modified Rankin (Stroke Patients Only)       Balance Overall balance assessment: Mild deficits observed, not formally tested                                           Pertinent Vitals/Pain Pain Assessment: Faces Pain Score:  3  Faces Pain Scale: Hurts a little bit Pain Location: back Pain Descriptors / Indicators: Sore Pain Intervention(s): Limited activity within patient's tolerance;Monitored during session;Repositioned    Home Living Family/patient expects to be discharged to:: Private residence Living Arrangements:  Non-relatives/Friends Available Help at Discharge: Home health;Available 24 hours/day (initially 24/7, then intermittent) Type of Home: House Home Access: Stairs to enter Entrance Stairs-Rails: Right Entrance Stairs-Number of Steps: 2 Home Layout: One level Home Equipment: Walker - 4 wheels;Adaptive equipment;Tub bench (walking stick)      Prior Function Level of Independence: Independent         Comments: limited by pain     Hand Dominance   Dominant Hand: Right    Extremity/Trunk Assessment   Upper Extremity Assessment Upper Extremity Assessment: Overall WFL for tasks assessed    Lower Extremity Assessment Lower Extremity Assessment: Overall WFL for tasks assessed    Cervical / Trunk Assessment Cervical / Trunk Assessment: Other exceptions Cervical / Trunk Exceptions: s/p lumbar spine surgery, large body habitus  Communication   Communication: No difficulties  Cognition Arousal/Alertness: Awake/alert Behavior During Therapy: WFL for tasks assessed/performed Overall Cognitive Status: Within Functional Limits for tasks assessed                                        General Comments General comments (skin integrity, edema, etc.): VSS on RA, pt given new back brace which is more comfortable on abdomen    Exercises     Assessment/Plan    PT Assessment Patient needs continued PT services  PT Problem List Decreased range of motion;Decreased strength;Decreased mobility;Decreased balance       PT Treatment Interventions DME instruction;Gait training;Stair training;Functional mobility training;Therapeutic activities;Therapeutic exercise;Balance training;Patient/family education    PT Goals (Current goals can be found in the Care Plan section)  Acute Rehab PT Goals Patient Stated Goal: to go home tomorrow PT Goal Formulation: With patient Time For Goal Achievement: 09/15/20 Potential to Achieve Goals: Good    Frequency Min 5X/week     AM-PAC PT "6 Clicks" Mobility  Outcome Measure Help needed turning from your back to your side while in a flat bed without using bedrails?: A Little Help needed moving from lying on your back to sitting on the side of a flat bed without using bedrails?: A Little Help needed moving to and from a bed to a chair (including a wheelchair)?: A Little Help needed standing up from a chair using your arms (e.g., wheelchair or bedside chair)?: None Help needed to walk in hospital room?: A Little Help needed climbing 3-5 steps with a railing? : A Little 6 Click Score: 19    End of Session Equipment Utilized During Treatment: Gait belt;Back brace Activity Tolerance: Patient tolerated treatment well Patient left: in chair;with call bell/phone within reach;with family/visitor present Nurse Communication: Mobility status PT Visit Diagnosis: Other abnormalities of gait and mobility (R26.89)    Time: 1696-7893 PT Time Calculation (min) (ACUTE ONLY): 35 min   Charges:   PT Evaluation $PT Eval Low Complexity: 1 Low PT Treatments $Gait Training: 8-22 mins        Karma Ganja, PT, DPT   Acute Rehabilitation Department Pager #: [phone]  Otho Bellows 09/01/2020, 4:45 PM

## 2020-09-01 NOTE — Progress Notes (Signed)
Orthopedic Tech Progress Note Patient Details:  Denise Foster [birth year] [id number] PACU RN stated "patient family member who dropped her off this morning should have the brace  In the car" Patient ID: Denise Foster, female   DOB: Oct 11, 1950, 70 y.o.   MRN: [id number]   Denise Foster 09/01/2020, 12:50 PM

## 2020-09-01 NOTE — Transfer of Care (Signed)
Immediate Anesthesia Transfer of Care Note  Patient: Denise Foster  Procedure(s) Performed: POSTERIOR LUMBAR INTERBODY FUSION, INTERBODY PROSTHESIS, POSTERIOR INSTRUMENTATION, LUMBAR FOUR-FIVE (N/A Back)  Patient Location: PACU  Anesthesia Type:General  Level of Consciousness: drowsy and patient cooperative  Airway & Oxygen Therapy: Patient Spontanous Breathing and Patient connected to nasal cannula oxygen  Post-op Assessment: Report given to RN, Post -op Vital signs reviewed and stable and Patient moving all extremities  Post vital signs: Reviewed and stable  Last Vitals:  Vitals Value Taken Time  BP 146/76 09/01/20 1116  Temp    Pulse 85 09/01/20 1117  Resp 20 09/01/20 1117  SpO2 95 % 09/01/20 1117  Vitals shown include unvalidated device data.  Last Pain:  Vitals:   09/01/20 0643  TempSrc:   PainSc: 5       Patients Stated Pain Goal: 3 (82/08/13 8871)  Complications: No complications documented.

## 2020-09-01 NOTE — Evaluation (Signed)
Occupational Therapy Evaluation Patient Details Name: Denise Foster MRN: [id number] DOB: 10-08-1950 Today's Date: 09/01/2020    History of Present Illness This 70 yo female underwent bilateral L4-5 laminotomy/foraminotomies/medial facetectomy to decompress the bilateral L4 and L5 nerve roots; and L4-5 transforaminal lumbar interbody fusion   Clinical Impression   This 70 yo female admitted and underwent above presents to acute OT with PLOF of being independent to to Mod I with all basic ADLs and doing most IADLs. She currently needs more A due to back precautions she now has. She will continue to benefit from acute OT without need for follow up.    Follow Up Recommendations  No OT follow up;Supervision - Intermittent    Equipment Recommendations  None recommended by OT       Precautions / Restrictions Precautions Precautions: Back Precaution Booklet Issued: Yes (comment) Required Braces or Orthoses: Spinal Brace Spinal Brace: Lumbar corset Restrictions Weight Bearing Restrictions: No Other Position/Activity Restrictions: Brace not currently in room, pt and friend in room report they (hospital) are supposed to be getting her a new one that fits better than the one she has at home      Mobility Bed Mobility Overal bed mobility: Needs Assistance Bed Mobility: Rolling;Sidelying to Sit Rolling: Min guard Sidelying to sit: Min guard       General bed mobility comments: VCs for sequencing    Transfers Overall transfer level: Needs assistance Equipment used: None Transfers: Sit to/from Stand Sit to Stand: Min guard              Balance Overall balance assessment: Mild deficits observed, not formally tested                                         ADL either performed or assessed with clinical judgement   ADL Overall ADL's : Needs assistance/impaired Eating/Feeding: Independent;Sitting   Grooming: Wash/dry  hands;Supervision/safety;Standing Grooming Details (indicate cue type and reason): Educated on use of two cups for brushing teeth to avoid bending over sink Upper Body Bathing: Set up;Sitting   Lower Body Bathing: Minimal assistance Lower Body Bathing Details (indicate cue type and reason): min guard A sit<>stand Upper Body Dressing : Set up;Sitting   Lower Body Dressing: Moderate assistance Lower Body Dressing Details (indicate cue type and reason): min A sit<>stand, wears thigh high ted hose as well Toilet Transfer: Min guard;Ambulation;Comfort height toilet;Grab bars   Toileting- Clothing Manipulation and Hygiene: Supervision/safety;Sit to/from stand Toileting - Clothing Manipulation Details (indicate cue type and reason): recommended wet wipes for back peri care, pt also has a bidet             Vision Patient Visual Report: No change from baseline              Pertinent Vitals/Pain Pain Assessment: 0-10 Pain Score: 3  Pain Location: back Pain Descriptors / Indicators: Sore Pain Intervention(s): Limited activity within patient's tolerance;Monitored during session     Hand Dominance Right   Extremity/Trunk Assessment Upper Extremity Assessment Upper Extremity Assessment: Overall WFL for tasks assessed           Communication Communication Communication: No difficulties   Cognition Arousal/Alertness: Awake/alert Behavior During Therapy: WFL for tasks assessed/performed Overall Cognitive Status: Within Functional Limits for tasks assessed  Home Living Family/patient expects to be discharged to:: Private residence Living Arrangements: Non-relatives/Friends Available Help at Discharge: Friend(s) Type of Home: House Home Access: Stairs to enter [employer] of Steps: 2 Entrance Stairs-Rails: Right Home Layout: One level     Bathroom Shower/Tub: [place] unit;Curtain   Armed forces operational officer: [place]: Environmental consultant - 4 wheels;Shower seat          Prior Functioning/Environment Level of Independence: Independent                 OT Problem List: Decreased range of motion;Impaired balance (sitting and/or standing);Obesity;Pain      OT Treatment/Interventions: Self-care/ADL training;DME and/or AE instruction;Patient/family education;Balance training    OT Goals(Current goals can be found in the care plan section) Acute Rehab OT Goals Patient Stated Goal: to go home tomorrow OT Goal Formulation: With patient Time For Goal Achievement: 09/15/20 Potential to Achieve Goals: Good  OT Frequency: Min 2X/week              AM-PAC OT "6 Clicks" Daily Activity     Outcome Measure Help from another person eating meals?: None Help from another person taking care of personal grooming?: A Little Help from another person toileting, which includes using toliet, bedpan, or urinal?: A Little Help from another person bathing (including washing, rinsing, drying)?: A Lot Help from another person to put on and taking off regular upper body clothing?: A Little Help from another person to put on and taking off regular lower body clothing?: A Lot 6 Click Score: 17   End of Session Nurse Communication:  (chat text about them getting her a brace that fits beter)  Activity Tolerance: Patient tolerated treatment well Patient left: in chair;with call bell/phone within reach;with family/visitor present  OT Visit Diagnosis: Unsteadiness on feet (R26.81);Muscle weakness (generalized) (M62.81);Pain Pain - part of body:  (incisional pain)                Time: 9030-0923 OT Time Calculation (min): 30 min Charges:  OT General Charges $OT Visit: 1 Visit OT Treatments $Self Care/Home Management : 8-22 mins  [place] Circle, OTR/L Acute [employer] Pager [phone] Office [phone]     Almon Register 09/01/2020, 4:01 PM

## 2020-09-01 NOTE — Anesthesia Procedure Notes (Signed)
Procedure Name: Intubation Date/Time: 09/01/2020 7:32 AM Performed by: Moshe Salisbury, CRNA Pre-anesthesia Checklist: Patient identified, Emergency Drugs available, Suction available and Patient being monitored Patient Re-evaluated:Patient Re-evaluated prior to induction Oxygen Delivery Method: Circle System Utilized Preoxygenation: Pre-oxygenation with 100% oxygen Induction Type: IV induction Ventilation: Mask ventilation without difficulty Laryngoscope Size: Mac and 3 Grade View: Grade I Tube type: Oral Tube size: 7.5 mm Number of attempts: 1 Airway Equipment and Method: Stylet and Oral airway Placement Confirmation: ETT inserted through vocal cords under direct vision,  positive ETCO2 and breath sounds checked- equal and bilateral Secured at: 21 cm Tube secured with: Tape Dental Injury: Teeth and Oropharynx as per pre-operative assessment

## 2020-09-01 NOTE — Anesthesia Procedure Notes (Signed)
Performed by: Moshe Salisbury, CRNA

## 2020-09-01 NOTE — H&P (Signed)
Subjective: The patient is a 70 year old white female who is complained of back and leg pain, numbness, tingling, weakness, etc.  She has failed medical management.  She was worked up with a lumbar MRI which demonstrated critical stenosis at L4-5 with a spondylolisthesis.  She has more moderate stenosis at L3-4.  I discussed the various treatment options with her.  She has decided proceed with surgery.  Past Medical History:  Diagnosis Date  . ADHD   . Anxiety   . Atypical mole 12/29/2018   atypical intradermal melanmocytic proliferation on left side chest - excision  . Depression   . Fibromyalgia   . Hypercholesterolemia   . Osteoarthritis   . PONV (postoperative nausea and vomiting)    "could hear anything said" years ago  . Pre-diabetes   . PTSD (post-traumatic stress disorder)   . Sigmoid diverticulitis 08/2019  . Spinal stenosis     Past Surgical History:  Procedure Laterality Date  . BREAST EXCISIONAL BIOPSY Left 1995   benign  . CARPAL TUNNEL RELEASE    . COLONOSCOPY  2005   Per patient, [facility] in [facility] with diverticulosis and small polyps  . COLONOSCOPY WITH PROPOFOL N/A 02/18/2020   Procedure: COLONOSCOPY WITH PROPOFOL;  Surgeon: Daneil Dolin, MD;  Location: [facility];  Service: Endoscopy;  Laterality: N/A;  7:30am  . PAROTIDECTOMY    . POLYPECTOMY  02/18/2020   Procedure: POLYPECTOMY;  Surgeon: Daneil Dolin, MD;  Location: [facility];  Service: Endoscopy;;  . TONSILLECTOMY      Allergies  Allergen Reactions  . Bee Venom Anaphylaxis    BEE STINGS  . Sulfa Antibiotics Other (See Comments)    Blisters/ulcers.    Social History   Tobacco Use  . Smoking status: Never Smoker  . Smokeless tobacco: Never Used  Substance Use Topics  . Alcohol use: Yes    Comment: rarely     Family History  Problem Relation Age of Onset  . Breast cancer Neg Hx   . Colon cancer Neg Hx    Prior to Admission medications   Medication Sig Start Date End Date Taking?  Authorizing Provider  acetaminophen (TYLENOL) 650 MG CR tablet Take 1,300 mg by mouth daily.   Yes [provider]  Alpha-Lipoic Acid 600 MG TABS Take 600 mg by mouth daily.   Yes [provider]  ALPRAZolam Duanne Moron) 0.5 MG tablet Take 0.5 mg by mouth at bedtime.   Yes [provider]  busPIRone (BUSPAR) 15 MG tablet Take 15 mg by mouth daily as needed (anxiety.).    Yes [provider]  cetirizine (ZYRTEC) 10 MG tablet Take 10 mg by mouth at bedtime.   Yes [provider]  Cholecalciferol (VITAMIN D-3) 125 MCG (5000 UT) TABS Take 10,000 Units by mouth daily.   Yes [provider]  cyclobenzaprine (FLEXERIL) 10 MG tablet Take 10 mg by mouth daily as needed for muscle spasms.  05/14/19  Yes [provider]  DULoxetine (CYMBALTA) 60 MG capsule Take 120 mg by mouth at bedtime.    Yes [provider]  EPIPEN 2-PAK 0.3 MG/0.3ML SOAJ injection Inject 0.3 mg into the muscle as needed for anaphylaxis ((bee stings)).  05/14/19  Yes [provider]  ezetimibe (ZETIA) 10 MG tablet Take 10 mg by mouth at bedtime.  09/27/19  Yes [provider]  gabapentin (NEURONTIN) 300 MG capsule Take 300 mg by mouth at bedtime. 06/26/20  Yes [provider]  Magnesium 100 MG  TABS Take 100 mg by mouth 2 (two) times daily.   Yes [provider]  methylphenidate (RITALIN) 20 MG tablet Take 20 mg by mouth in the morning and at bedtime.  12/20/19  Yes [provider]  Omega-3 Fatty Acids (FISH OIL PO) Take 230 mg by mouth at bedtime. Cod liver   Yes [provider]  Propylene Glycol (SYSTANE BALANCE) 0.6 % SOLN Place 1 drop into both eyes in the morning, at noon, and at bedtime.   Yes [provider]  RESTASIS 0.05 % ophthalmic emulsion Place 1 drop into both eyes 2 (two) times daily.  09/29/19  Yes [provider]  rosuvastatin (CRESTOR) 5 MG tablet Take 5 mg by mouth at bedtime.   Yes  [provider]  Sodium Fluoride (CLINPRO 5000) 1.1 % PSTE Place 1 application onto teeth at bedtime.   Yes [provider]  zinc gluconate 50 MG tablet Take 50 mg by mouth daily.   Yes [provider]     Review of Systems  Positive ROS: As above  All other systems have been reviewed and were otherwise negative with the exception of those mentioned in the HPI and as above.  Objective: Vital signs in last 24 hours: Temp:  [98.1 F (36.7 C)-98.5 F (36.9 C)] 98.5 F (36.9 C) (04/04 1497) Pulse Rate:  [71-73] 73 (04/04 0608) Resp:  [17] 17 (04/04 0550) BP: (145-167)/(53-85) 145/85 (04/04 0608) SpO2:  [94 %-100 %] 94 % (04/04 0608) Weight:  [83.9 kg] 83.9 kg (04/04 0608) Estimated body mass index is 40.74 kg/m as calculated from the following:   Height as of this encounter: 4' 8.5" (1.435 m).   Weight as of this encounter: 83.9 kg.   General Appearance: Alert Head: Normocephalic, without obvious abnormality, atraumatic Eyes: PERRL, conjunctiva/corneas clear, EOM's intact,    Ears: Normal  Throat: Normal  Neck: Supple, Back: unremarkable Lungs: Clear to auscultation bilaterally, respirations unlabored Heart: Regular rate and rhythm, no murmur, rub or gallop Abdomen: Soft, non-tender Extremities: Extremities normal, atraumatic, no cyanosis or edema Skin: unremarkable  NEUROLOGIC:   Mental status: alert and oriented,Motor Exam -she has weakness in her bilateral EHL/dorsiflexors.  Sensory Exam - grossly normal Reflexes:  Coordination - grossly normal Gait - grossly normal Balance - grossly normal Cranial Nerves: I: smell Not tested  II: visual acuity  OS: Normal  OD: Normal   II: visual fields Full to confrontation  II: pupils Equal, round, reactive to light  III,VII: ptosis None  III,IV,VI: extraocular muscles  Full ROM  V: mastication Normal  V: facial light touch sensation  Normal  V,VII: corneal reflex  Present  VII: facial muscle  function - upper  Normal  VII: facial muscle function - lower Normal  VIII: hearing Not tested  IX: soft palate elevation  Normal  IX,X: gag reflex Present  XI: trapezius strength  5/5  XI: sternocleidomastoid strength 5/5  XI: neck flexion strength  5/5  XII: tongue strength  Normal    Data Review Lab Results  Component Value Date   WBC 13.0 (H) 09/01/2020   HGB 13.3 09/01/2020   HCT 41.6 09/01/2020   MCV 94.8 09/01/2020   PLT 359 09/01/2020   Lab Results  Component Value Date   NA 133 (L) 09/19/2019   K 3.3 (L) 09/19/2019   CL 101 09/19/2019   CO2 20 (L) 09/19/2019   BUN 11 09/19/2019   CREATININE 0.78 09/19/2019   GLUCOSE 178 (H) 09/19/2019  No results found for: INR, PROTIME  Assessment/Plan: L4-5 spondylolisthesis, spinal stenosis, lumbago, lumbar radiculopathy, neurogenic claudication: I have discussed the situation with the patient.  I reviewed her imaging studies with her and pointed out the abnormalities.  We have discussed the various treatment options including surgery.  I have described the surgical treatment option of an L4-5 decompression, instrumentation and fusion.  I have shown her surgical models.  I have given her a surgical pamphlet.  We have discussed the risk, benefits, alternatives, expected postop course, and likelihood of achieving our goals with surgery.  I have answered all questions.  She has decided proceed with surgery.   Ophelia Charter 09/01/2020 7:17 AM

## 2020-09-01 NOTE — Op Note (Signed)
Brief history: The patient is a 70 year old white female who has complained of back and bilateral leg pain consistent with neurogenic claudication.  She failed medical management.  She was worked up with a lumbar MRI and lumbar x-rays which demonstrated a degenerative scoliosis, lumbar spine listhesis, lumbar spinal stenosis, etc.  I discussed the various treatment options with her.  She has decided proceed with surgery.  Preoperative diagnosis: Adult degenerative scoliosis, L4-5 spondylolisthesis, degenerative disc disease, spinal stenosis compressing both the L4 and the L5 nerve roots; lumbago; lumbar radiculopathy; neurogenic claudication  Postoperative diagnosis: The same  Procedure: Bilateral L4-5 laminotomy/foraminotomies/medial facetectomy to decompress the bilateral L4 and L5 nerve roots(the work required to do this was in addition to the work required to do the posterior lumbar interbody fusion because of the patient's spinal stenosis, facet arthropathy. Etc. requiring a wide decompression of the nerve roots.);  L4-5 transforaminal lumbar interbody fusion with local morselized autograft bone and Zimmer DBM; insertion of interbody prosthesis at L4-5 (globus peek expandable interbody prosthesis); posterior nonsegmental instrumentation from L4 to L5 with globus titanium pedicle screws and rods; posterior lateral arthrodesis at L4-5 with local morselized autograft bone and Zimmer DBM.  Surgeon: Dr. Earle Gell  Asst.: Arnetha Massy, NP  Anesthesia: Gen. endotracheal  Estimated blood loss: 200 cc  Drains: None  Complications: None  Description of procedure: The patient was brought to the operating room by the anesthesia team. General endotracheal anesthesia was induced. The patient was turned to the prone position on the Wilson frame. The patient's lumbosacral region was then prepared with Betadine scrub and Betadine solution. Sterile drapes were applied.  I then injected the area to be  incised with Marcaine with epinephrine solution. I then used the scalpel to make a linear midline incision over the L4-5 interspace. I then used electrocautery to perform a bilateral subperiosteal dissection exposing the spinous process and lamina of L4 and L5. We then obtained intraoperative radiograph to confirm our location. We then inserted the Verstrac retractor to provide exposure.  I began the decompression by using the high speed drill to perform laminotomies at L4-5 bilaterally. We then used the Kerrison punches to widen the laminotomy and removed the ligamentum flavum at L4-5 bilaterally. We used the Kerrison punches to remove the medial facets at L4-5 bilaterally. We performed wide foraminotomies about the bilateral L4 and L5 nerve roots completing the decompression.  We now turned our attention to the posterior lumbar interbody fusion. I used a scalpel to incise the intervertebral disc at L4-5 bilaterally. I then performed a partial intervertebral discectomy at L4-5 bilaterally using the pituitary forceps. We prepared the vertebral endplates at U2-3 bilaterally for the fusion by removing the soft tissues with the curettes. We then used the trial spacers to pick the appropriate sized interbody prosthesis. We prefilled his prosthesis with a combination of local morselized autograft bone that we obtained during the decompression as well as Zimmer DBM. We inserted the prefilled prosthesis into the interspace at L4-5, we then turned and expanded the prosthesis. There was a good snug fit of the prosthesis in the interspace. We then filled and the remainder of the intervertebral disc space with local morselized autograft bone and Zimmer DBM. This completed the posterior lumbar interbody arthrodesis.  During the decompression and insertion of the prosthesis the assistant protected the thecal sac and nerve roots with the D'Errico retractor.  We now turned attention to the instrumentation. Under fluoroscopic  guidance we cannulated the bilateral L4 and L5 pedicles with  the bone probe. We then removed the bone probe. We then tapped the pedicle with a 5.5 millimeter tap. We then removed the tap. We probed inside the tapped pedicle with a ball probe to rule out cortical breaches. We then inserted a 6.5 x 45 millimeter pedicle screw into the L4 and L5 pedicles bilaterally under fluoroscopic guidance. We then palpated along the medial aspect of the pedicles to rule out cortical breaches. There were none. The nerve roots were not injured. We then connected the unilateral pedicle screws with a lordotic rod. We compressed the construct and secured the rod in place with the caps. We then tightened the caps appropriately. This completed the instrumentation from L4-5 bilaterally.  We now turned our attention to the posterior lateral arthrodesis at L4-5. We used the high-speed drill to decorticate the remainder of the facets, pars, transverse process at L4-5. We then applied a combination of local morselized autograft bone and Zimmer DBM over these decorticated posterior lateral structures. This completed the posterior lateral arthrodesis.  We then obtained hemostasis using bipolar electrocautery. We irrigated the wound out with bacitracin solution. We inspected the thecal sac and nerve roots and noted they were well decompressed. We then removed the retractor.  We injected Exparel . We reapproximated patient's thoracolumbar fascia with interrupted #1 Vicryl suture. We reapproximated patient's subcutaneous tissue with interrupted 2-0 Vicryl suture. The reapproximated patient's skin with Steri-Strips and benzoin. The wound was then coated with bacitracin ointment. A sterile dressing was applied. The drapes were removed. The patient was subsequently returned to the supine position where they were extubated by the anesthesia team. He was then transported to the post anesthesia care unit in stable condition. All sponge instrument  and needle counts were reportedly correct at the end of this case.

## 2020-09-02 LAB — BASIC METABOLIC PANEL
Anion gap: 5 (ref 5–15)
BUN: 11 mg/dL (ref 8–23)
CO2: 28 mmol/L (ref 22–32)
Calcium: 8.9 mg/dL (ref 8.9–10.3)
Chloride: 108 mmol/L (ref 98–111)
Creatinine, Ser: 0.57 mg/dL (ref 0.44–1.00)
GFR, Estimated: >60 mL/min (ref >60)
Glucose, Bld: 139 mg/dL — ABNORMAL HIGH (ref 70–99)
Potassium: 4.3 mmol/L (ref 3.5–5.1)
Sodium: 141 mmol/L (ref 135–145)

## 2020-09-02 LAB — CBC
HCT: 36.5 % (ref 36.0–46.0)
Hemoglobin: 11.9 g/dL — ABNORMAL LOW (ref 12.0–15.0)
MCH: 30.7 pg (ref 26.0–34.0)
MCHC: 32.6 g/dL (ref 30.0–36.0)
MCV: 94.1 fL (ref 80.0–100.0)
Platelets: 330 10*3/uL (ref 150–400)
RBC: 3.88 MIL/uL (ref 3.87–5.11)
RDW: 13.9 % (ref 11.5–15.5)
WBC: 16.7 10*3/uL — ABNORMAL HIGH (ref 4.0–10.5)
nRBC: 0 % (ref 0.0–0.2)

## 2020-09-02 MED ORDER — OXYCODONE-ACETAMINOPHEN 5-325 MG PO TABS: 1.0000 | ORAL_TABLET | ORAL | 0 refills | Status: DC | PRN

## 2020-09-02 MED ORDER — CYCLOBENZAPRINE HCL 10 MG PO TABS: 10.0000 mg | ORAL_TABLET | Freq: Every day | ORAL | 0 refills | Status: DC | PRN

## 2020-09-02 MED ORDER — DOCUSATE SODIUM 100 MG PO CAPS: 100.0000 mg | ORAL_CAPSULE | Freq: Two times a day (BID) | ORAL | 0 refills | Status: DC

## 2020-09-02 MED ORDER — OXYCODONE-ACETAMINOPHEN 5-325 MG PO TABS: 1.0000 | ORAL_TABLET | ORAL | Status: DC | PRN

## 2020-09-02 NOTE — Progress Notes (Signed)
Physical Therapy Treatment Patient Details Name: Denise Foster MRN: [id number] DOB: Jun 03, 1950 Today's Date: 09/02/2020    History of Present Illness The pt is a 70 yo female presenting 4/4 s/p bilateral L4-5 laminotomy/foraminotomies to decompress L4 and L5 nerve roots and L4-5 TLIF. PMH includes: anxiety, OA, fibromyalgia, and carpal tunnel release.    PT Comments    Pt progressing towards physical therapy goals. Was able to perform transfers and ambulation with gross min guard assist and no AD. Brace adjusted for optimal fit during session with pt report of feeling more supportive after. Pt was educated on precautions, brace application/wearing schedule, car transfer, and proper activity progression. Will continue to follow and progress as able per POC.     Follow Up Recommendations  Home health PT;Supervision for mobility/OOB     Equipment Recommendations  None recommended by PT    Recommendations for Other Services       Precautions / Restrictions Precautions Precautions: Back Precaution Booklet Issued: Yes (comment) Precaution Comments: Verbally reviewed precautions during functional mobility. Required Braces or Orthoses: Spinal Brace Spinal Brace: Lumbar corset;Applied in sitting position Restrictions Weight Bearing Restrictions: No Other Position/Activity Restrictions: Brace is now here and she reports it fits so much better    Mobility  Bed Mobility               General bed mobility comments: Pt was received sitting up in recliner    Transfers Overall transfer level: Modified independent Equipment used: None Transfers: Sit to/from Stand           General transfer comment: Pt demonstrated proper hand placement on seated surface for safety.  Ambulation/Gait Ambulation/Gait assistance: Modified independent (Device/Increase time) Gait Distance (Feet): 200 Feet Assistive device: None Gait Pattern/deviations: Step-through pattern;Decreased  stride length Gait velocity: Decreased Gait velocity interpretation: <1.31 ft/sec, indicative of household ambulator General Gait Details: Slow but generally steady without UE support. No assist required.   Stairs             Wheelchair Mobility    Modified Rankin (Stroke Patients Only)       Balance Overall balance assessment: Mild deficits observed, not formally tested                                          Cognition Arousal/Alertness: Awake/alert Behavior During Therapy: WFL for tasks assessed/performed Overall Cognitive Status: Within Functional Limits for tasks assessed                                        Exercises      General Comments        Pertinent Vitals/Pain Pain Assessment: Faces Faces Pain Scale: No hurt Pain Location: back Pain Descriptors / Indicators: Operative site guarding Pain Intervention(s): Limited activity within patient's tolerance;Monitored during session;Repositioned    Home Living Family/patient expects to be discharged to:: Private residence Living Arrangements: Non-relatives/Friends Available Help at Discharge: Home health;Available 24 hours/day (initially 24/7, then intermittent) Type of Home: House Home Access: Stairs to enter Entrance Stairs-Rails: Right Home Layout: One level Home Equipment: Walker - 4 wheels;Adaptive equipment;Tub bench (walking stick)      Prior Function Level of Independence: Independent      Comments: limited by pain   PT Goals (current goals can now be found in  the care plan section) Acute Rehab PT Goals Patient Stated Goal: to go home today PT Goal Formulation: With patient Time For Goal Achievement: 09/15/20 Potential to Achieve Goals: Good    Frequency    Min 5X/week      PT Plan      Co-evaluation              AM-PAC PT "6 Clicks" Mobility   Outcome Measure  Help needed turning from your back to your side while in a flat bed  without using bedrails?: None Help needed moving from lying on your back to sitting on the side of a flat bed without using bedrails?: None Help needed moving to and from a bed to a chair (including a wheelchair)?: None Help needed standing up from a chair using your arms (e.g., wheelchair or bedside chair)?: None Help needed to walk in hospital room?: None Help needed climbing 3-5 steps with a railing? : A Little 6 Click Score: 23    End of Session Equipment Utilized During Treatment: Gait belt;Back brace Activity Tolerance: Patient tolerated treatment well Patient left: in chair;with call bell/phone within reach;with family/visitor present Nurse Communication: Mobility status PT Visit Diagnosis: Other abnormalities of gait and mobility (R26.89)     Time: 1014-1030 PT Time Calculation (min) (ACUTE ONLY): 16 min  Charges:  $Gait Training: 8-22 mins                     Rolinda Roan, PT, DPT Acute Rehabilitation Services Pager: [phone] Office: [phone]    Thelma Comp 09/02/2020, 10:51 AM

## 2020-09-02 NOTE — Progress Notes (Signed)
Occupational Therapy Treatment and Discharge Patient Details Name: Denise Foster MRN: [id number] DOB: [birth year] Today's Date: 09/02/2020    History of present illness The pt is a 70 yo female presenting 4/4 s/p bilateral L4-5 laminotomy/foraminotomies to decompress L4 and L5 nerve roots and L4-5 TLIF. PMH includes: anxiety, OA, fibromyalgia, and carpal tunnel release.   OT comments  This 69 yo female admitted with above seen today to complete education on LB AE, pt was able to use it to don/doff thigh high ted hose. No further OT warranted, we will D/C from acute OT.   Follow Up Recommendations  No OT follow up;Supervision - Intermittent    Equipment Recommendations  None recommended by OT       Precautions / Restrictions Precautions Precautions: Back Required Braces or Orthoses: Spinal Brace Spinal Brace: Lumbar corset;Applied in sitting position Restrictions Weight Bearing Restrictions: No Other Position/Activity Restrictions: Brace is now here and she reports it fits so much better       Mobility Bed Mobility               General bed mobility comments: pt OOB in recliner at start and end of session           ADL either performed or assessed with clinical judgement   ADL Overall ADL's : Needs assistance/impaired                     Lower Body Dressing: Supervision/safety;Set up;With adaptive equipment;Adhering to back precautions;Sit to/from stand Lower Body Dressing Details (indicate cue type and reason): Educated pt on use of ted hose donner and pt was able to use it to don thigh high ted hose. I did have to modifiy the ted hose donner by putting rope on handles so she could get the donner where she needed it to get it started over her foot then pull it up without breaking back precautions. She then used a dressing stick to doff ted hose. She and friend are going to look on internet to find the two pieces for patient                      Vision Patient Visual Report: No change from baseline            Cognition Arousal/Alertness: Awake/alert Behavior During Therapy: WFL for tasks assessed/performed Overall Cognitive Status: Within Functional Limits for tasks assessed                                                     Pertinent Vitals/ Pain       Pain Assessment: 0-10 Faces Pain Scale: No hurt Pain Location: back Pain Descriptors / Indicators: Sore Pain Intervention(s): Limited activity within patient's tolerance;Monitored during session         Frequency  Min 2X/week        Progress Toward Goals  OT Goals(current goals can now be found in the care plan section)  Progress towards OT goals: Progressing toward goals  Acute Rehab OT Goals Patient Stated Goal: to go home today OT Goal Formulation: With patient Time For Goal Achievement: 09/15/20 Potential to Achieve Goals: Good  Plan Discharge plan remains appropriate       AM-PAC OT "6 Clicks" Daily Activity     Outcome Measure   Help from another  person eating meals?: None Help from another person taking care of personal grooming?: A Little Help from another person toileting, which includes using toliet, bedpan, or urinal?: A Little Help from another person bathing (including washing, rinsing, drying)?: A Little Help from another person to put on and taking off regular upper body clothing?: A Little Help from another person to put on and taking off regular lower body clothing?: A Little 6 Click Score: 19    End of Session    OT Visit Diagnosis: Unsteadiness on feet (R26.81);Muscle weakness (generalized) (M62.81);Pain Pain - part of body:  (incisional)   Activity Tolerance Patient tolerated treatment well   Patient Left in chair;with call bell/phone within reach;with family/visitor present           Time: 0833-0900 OT Time Calculation (min): 27 min  Charges: OT General Charges $OT Visit: 1 Visit OT  Treatments $Self Care/Home Management : 23-37 mins  Golden Circle, OTR/L Acute [employer] Pager [phone] Office [phone]      Almon Register 09/02/2020, 9:24 AM

## 2020-09-02 NOTE — Progress Notes (Signed)
Patient is discharged from room 3C08 at this time. Alert and in stable condition. IV site d/c'd and instructions read to patient and friend with understanding verbalized and all questions answered. Left unit via wheelchair with all belongings at side.

## 2020-09-02 NOTE — Discharge Instructions (Signed)

## 2020-09-02 NOTE — Discharge Summary (Signed)
Physician Discharge Summary  Patient ID: Denise Foster MRN: [id number] DOB/AGE: Apr 04, 1951 70 y.o.  Admit date: 09/01/2020 Discharge date: 09/02/2020  Admission Diagnoses: Lumbar spondylolisthesis, lumbar spinal stenosis with neurogenic claudication, lumbago, lumbar radiculopathy  Discharge Diagnoses: The same Active Problems:   Spondylolisthesis, lumbar region   Discharged Condition: good  Hospital Course: I performed an L4-5 decompression, instrumentation and fusion on the patient on 09/01/2020.  The surgery went well.  The patient's postoperative course was unremarkable.  On postoperative day #1 she requested discharge to home.  She was given oral and written discharge instructions.  All questions were answered.  Consults: PT, OT, care management Significant Diagnostic Studies: None Treatments: L4-5 decompression, instrumentation and fusion. Discharge Exam: Blood pressure 128/75, pulse 77, temperature 97.8 F (36.6 C), resp. rate 18, height 4' 8.5" (1.435 m), weight 83.9 kg, SpO2 98 %. The patient is alert and pleasant.  She looks well.  Her strength is normal.  Disposition: Home  Discharge Instructions    Call MD for:  difficulty breathing, headache or visual disturbances   Complete by: As directed    Call MD for:  extreme fatigue   Complete by: As directed    Call MD for:  hives   Complete by: As directed    Call MD for:  persistant dizziness or light-headedness   Complete by: As directed    Call MD for:  persistant nausea and vomiting   Complete by: As directed    Call MD for:  redness, tenderness, or signs of infection (pain, swelling, redness, odor or green/yellow discharge around incision site)   Complete by: As directed    Call MD for:  severe uncontrolled pain   Complete by: As directed    Call MD for:  temperature >100.4   Complete by: As directed    Diet - low sodium heart healthy   Complete by: As directed    Discharge instructions   Complete by: As  directed    Call [phone] for a followup appointment. Take a stool softener while you are using pain medications.   Driving Restrictions   Complete by: As directed    Do not drive for 2 weeks.   Increase activity slowly   Complete by: As directed    Lifting restrictions   Complete by: As directed    Do not lift more than 5 pounds. No excessive bending or twisting.   May shower / Bathe   Complete by: As directed    Remove the dressing for 3 days after surgery.  You may shower, but leave the incision alone.   Remove dressing in 48 hours   Complete by: As directed      Allergies as of 09/02/2020      Reactions   Bee Venom Anaphylaxis   BEE STINGS   Sulfa Antibiotics Other (See Comments)   Blisters/ulcers.      Medication List    STOP taking these medications   acetaminophen 650 MG CR tablet Commonly known as: TYLENOL     TAKE these medications   Alpha-Lipoic Acid 600 MG Tabs Take 600 mg by mouth daily.   ALPRAZolam 0.5 MG tablet Commonly known as: XANAX Take 0.5 mg by mouth at bedtime.   busPIRone 15 MG tablet Commonly known as: BUSPAR Take 15 mg by mouth daily as needed (anxiety.).   cetirizine 10 MG tablet Commonly known as: ZYRTEC Take 10 mg by mouth at bedtime.   Clinpro 5000 1.1 % Pste Generic drug: Sodium Fluoride  Place 1 application onto teeth at bedtime.   cyclobenzaprine 10 MG tablet Commonly known as: FLEXERIL Take 1 tablet (10 mg total) by mouth daily as needed for muscle spasms.   docusate sodium 100 MG capsule Commonly known as: COLACE Take 1 capsule (100 mg total) by mouth 2 (two) times daily.   DULoxetine 60 MG capsule Commonly known as: CYMBALTA Take 120 mg by mouth at bedtime.   EpiPen 2-Pak 0.3 mg/0.3 mL Soaj injection Generic drug: EPINEPHrine Inject 0.3 mg into the muscle as needed for anaphylaxis ((bee stings)).   ezetimibe 10 MG tablet Commonly known as: ZETIA Take 10 mg by mouth at bedtime.   FISH OIL PO Take 230 mg by  mouth at bedtime. Cod liver   gabapentin 300 MG capsule Commonly known as: NEURONTIN Take 300 mg by mouth at bedtime.   Magnesium 100 MG Tabs Take 100 mg by mouth 2 (two) times daily.   methylphenidate 20 MG tablet Commonly known as: RITALIN Take 20 mg by mouth in the morning and at bedtime.   oxyCODONE-acetaminophen 5-325 MG tablet Commonly known as: PERCOCET/ROXICET Take 1-2 tablets by mouth every 4 (four) hours as needed for moderate pain.   Restasis 0.05 % ophthalmic emulsion Generic drug: cycloSPORINE Place 1 drop into both eyes 2 (two) times daily.   rosuvastatin 5 MG tablet Commonly known as: CRESTOR Take 5 mg by mouth at bedtime.   Systane Balance 0.6 % Soln Generic drug: Propylene Glycol Place 1 drop into both eyes in the morning, at noon, and at bedtime.   Vitamin D-3 125 MCG (5000 UT) Tabs Take 10,000 Units by mouth daily.   zinc gluconate 50 MG tablet Take 50 mg by mouth daily.        Signed: Ophelia Charter 09/02/2020, 7:56 AM

## 2020-09-05 MED FILL — Sodium Chloride IV Soln 0.9%: INTRAVENOUS | Qty: 1000 | Status: AC

## 2020-09-05 MED FILL — Heparin Sodium (Porcine) Inj 1000 Unit/ML: INTRAMUSCULAR | Qty: 30 | Status: AC

## 2020-09-22 ENCOUNTER — Other Ambulatory Visit: Payer: Self-pay | Admitting: Obstetrics and Gynecology

## 2020-09-22 DIAGNOSIS — Z1231 Encounter for screening mammogram for malignant neoplasm of breast: Secondary | ICD-10-CM

## 2020-10-23 ENCOUNTER — Ambulatory Visit: Payer: Medicare HMO | Admitting: Physician Assistant

## 2020-11-11 ENCOUNTER — Ambulatory Visit: Payer: Medicare HMO

## 2020-11-17 IMAGING — MG DIGITAL SCREENING BILAT W/ TOMO W/ CAD
6 of 10 series · 6 of 30 positions shown · non-contrast
Comparison: Previous exam(s).

CLINICAL DATA: Screening.

EXAM:
DIGITAL SCREENING BILATERAL MAMMOGRAM WITH TOMO AND CAD

[R MLO synth-2D]
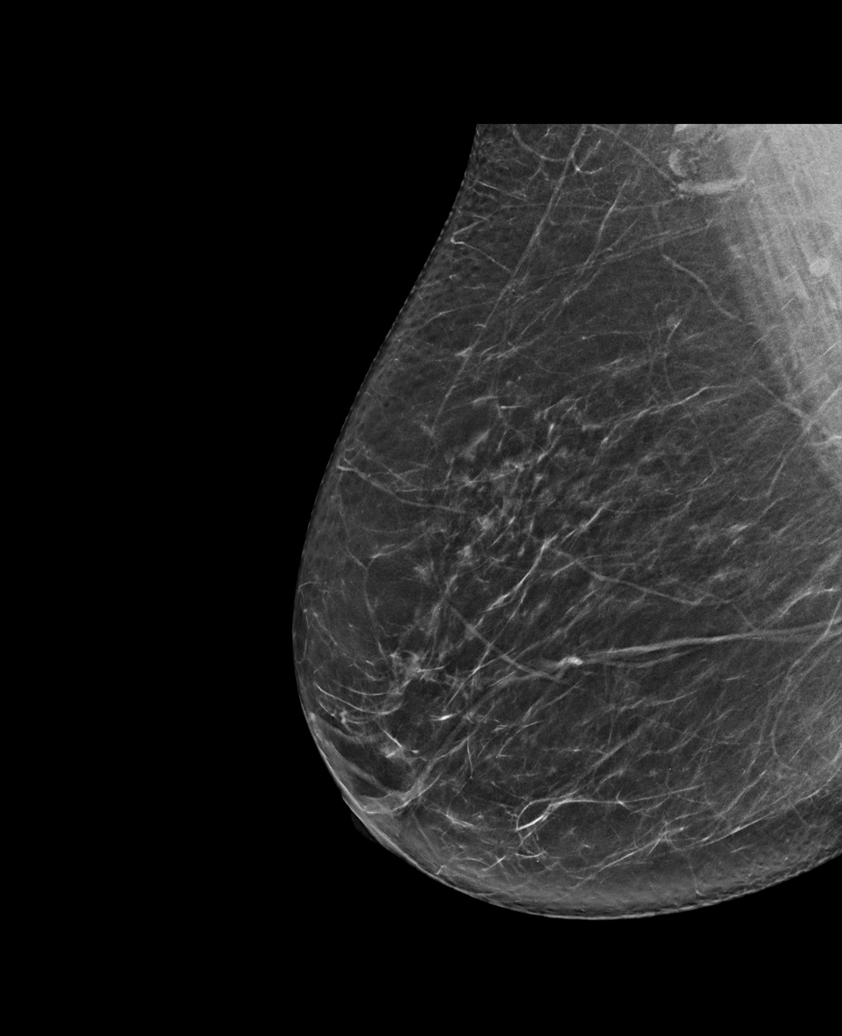

[L XCCL synth-2D]
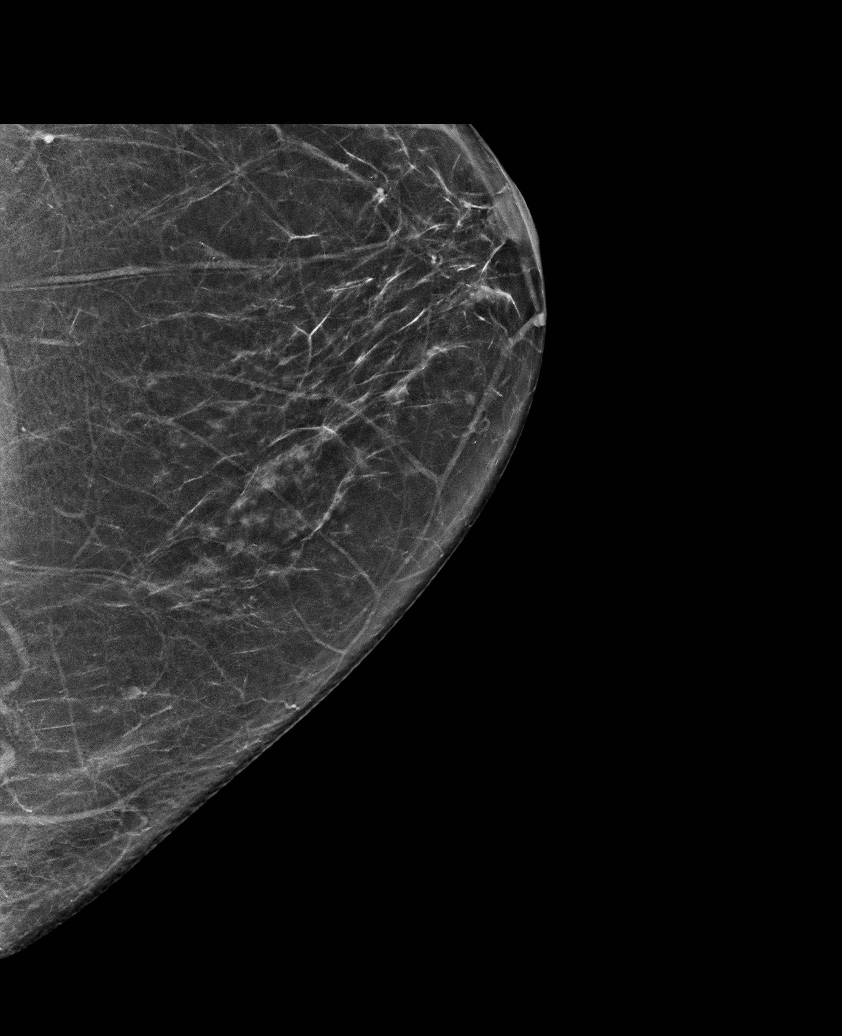

[R CC synth-2D]
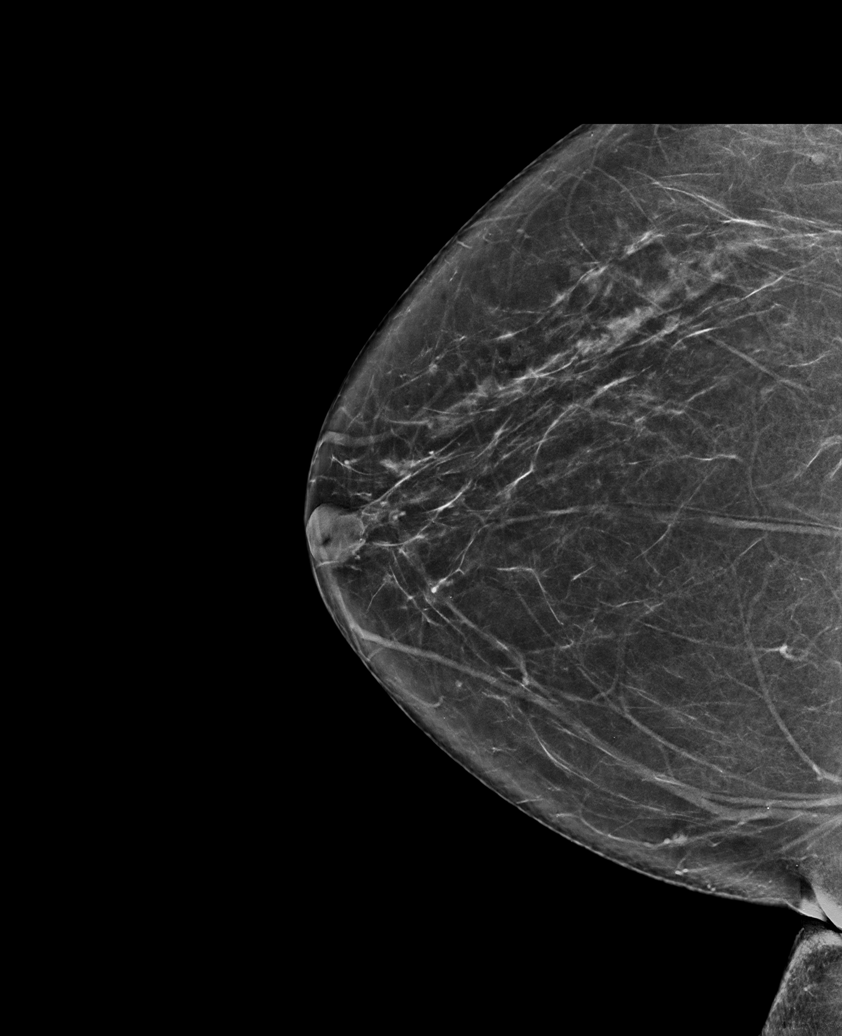

[L CC synth-2D]
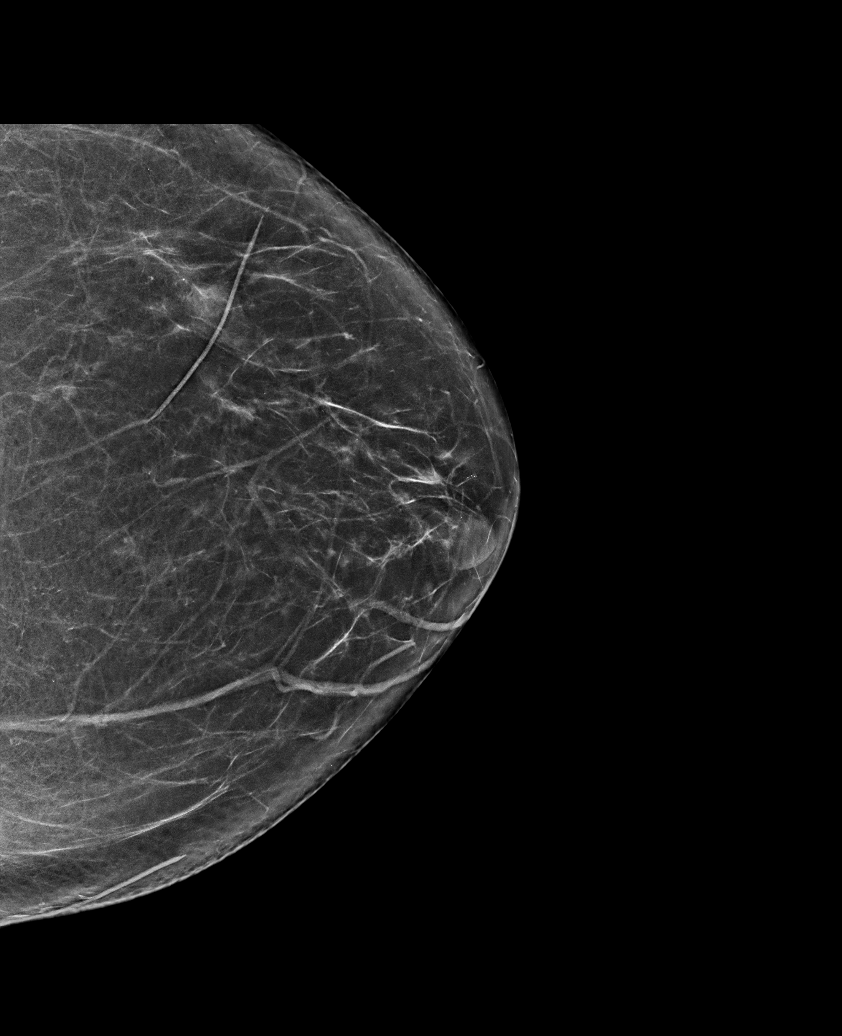

[L MLO synth-2D]
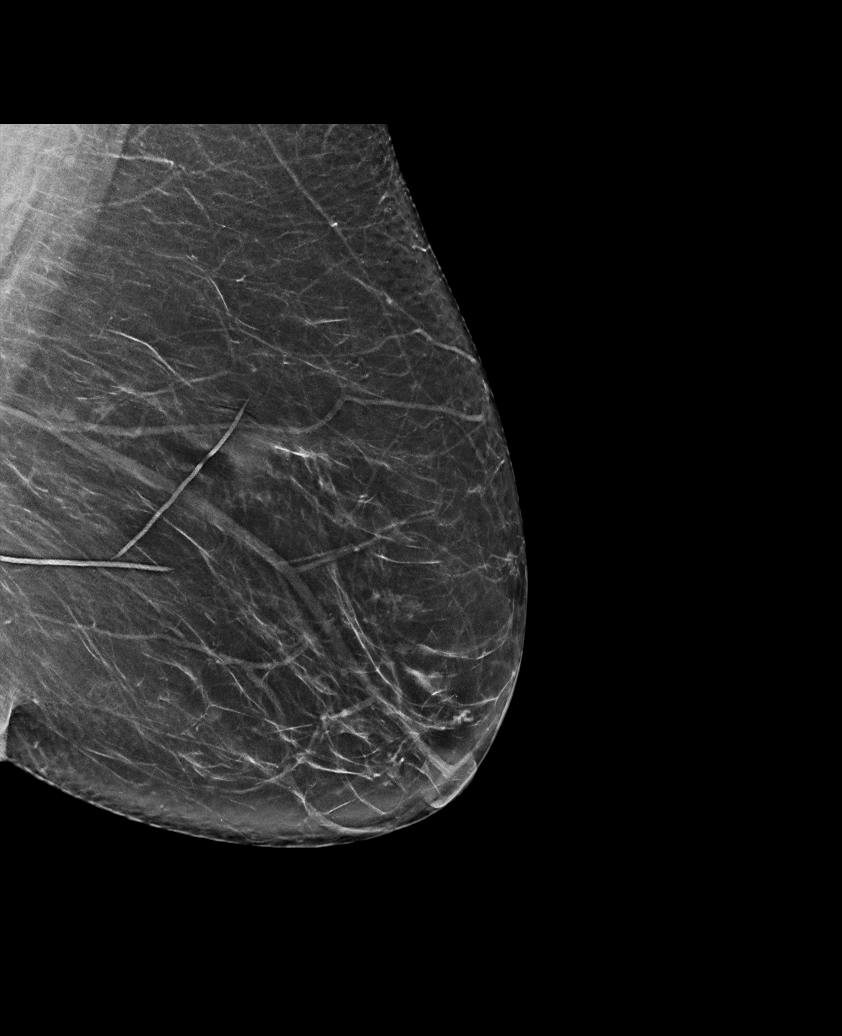

[L CC tomo · tomo slice 37/73.0]
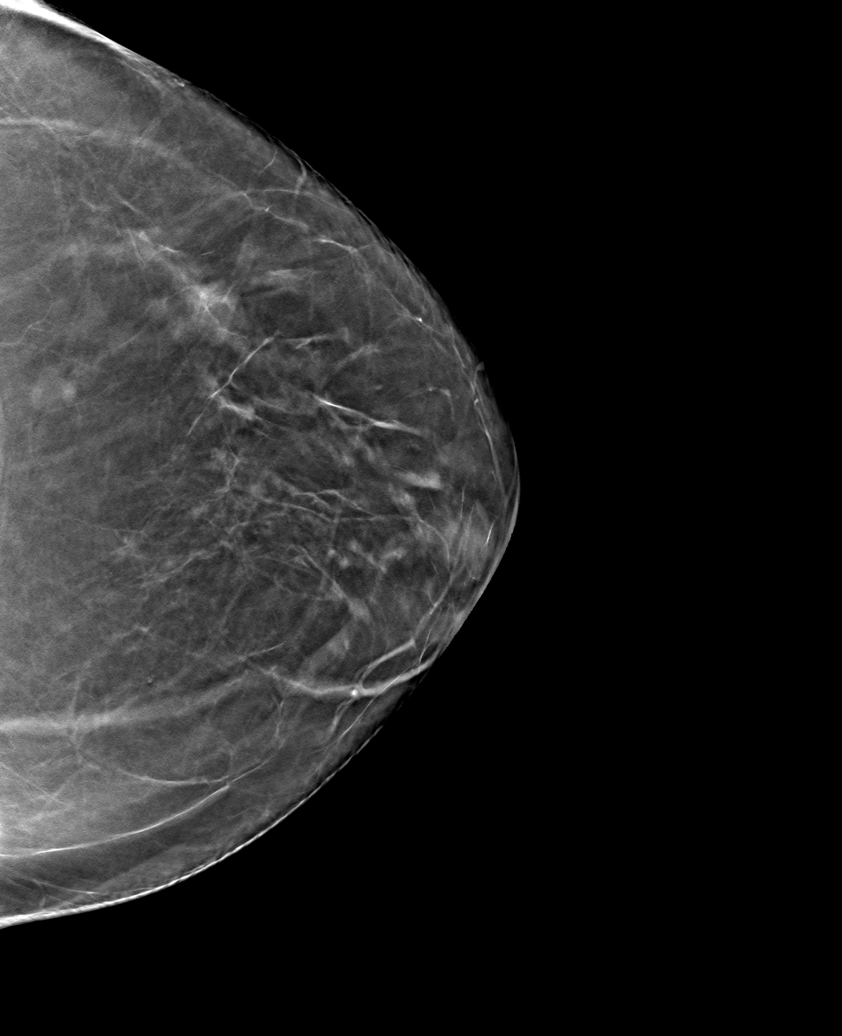

[6 of 30 positions shown; findings below may reference images not displayed]

ACR Breast Density Category b: There are scattered areas of
fibroglandular density.
FINDINGS: There are no findings suspicious for malignancy. Images were
processed with CAD.
IMPRESSION: No mammographic evidence of malignancy. A result letter of this
screening mammogram will be mailed directly to the patient.

RECOMMENDATION:
Screening mammogram in one year. (Code:[id number])

BI-RADS CATEGORY  1: Negative.

## 2020-12-31 ENCOUNTER — Other Ambulatory Visit: Payer: Self-pay

## 2020-12-31 ENCOUNTER — Ambulatory Visit
Admission: RE | Admit: 2020-12-31 | Discharge: 2020-12-31 | Disposition: A | Discharge status: Home / Self Care | Payer: Medicare HMO | Source: Ambulatory Visit | Attending: Obstetrics and Gynecology | Admitting: Obstetrics and Gynecology

## 2020-12-31 DIAGNOSIS — Z1231 Encounter for screening mammogram for malignant neoplasm of breast: Secondary | ICD-10-CM

## 2021-01-08 DIAGNOSIS — F411 Generalized anxiety disorder: Secondary | ICD-10-CM | POA: Insufficient documentation

## 2021-01-08 DIAGNOSIS — E782 Mixed hyperlipidemia: Secondary | ICD-10-CM | POA: Insufficient documentation

## 2021-01-08 DIAGNOSIS — R7303 Prediabetes: Secondary | ICD-10-CM | POA: Insufficient documentation

## 2021-01-08 DIAGNOSIS — E875 Hyperkalemia: Secondary | ICD-10-CM | POA: Insufficient documentation

## 2021-01-08 DIAGNOSIS — E559 Vitamin D deficiency, unspecified: Secondary | ICD-10-CM | POA: Insufficient documentation

## 2021-01-28 ENCOUNTER — Other Ambulatory Visit: Payer: Self-pay

## 2021-01-28 ENCOUNTER — Encounter: Payer: Self-pay | Admitting: Physician Assistant

## 2021-01-28 ENCOUNTER — Ambulatory Visit: Payer: Medicare HMO | Admitting: Physician Assistant

## 2021-01-28 DIAGNOSIS — Z1283 Encounter for screening for malignant neoplasm of skin: Secondary | ICD-10-CM

## 2021-01-28 DIAGNOSIS — L570 Actinic keratosis: Secondary | ICD-10-CM | POA: Diagnosis not present

## 2021-01-28 DIAGNOSIS — L709 Acne, unspecified: Secondary | ICD-10-CM

## 2021-01-28 DIAGNOSIS — D044 Carcinoma in situ of skin of scalp and neck: Secondary | ICD-10-CM | POA: Diagnosis not present

## 2021-01-28 DIAGNOSIS — D235 Other benign neoplasm of skin of trunk: Secondary | ICD-10-CM | POA: Diagnosis not present

## 2021-01-28 DIAGNOSIS — D485 Neoplasm of uncertain behavior of skin: Secondary | ICD-10-CM

## 2021-01-28 DIAGNOSIS — C4492 Squamous cell carcinoma of skin, unspecified: Secondary | ICD-10-CM

## 2021-01-28 HISTORY — DX: Squamous cell carcinoma of skin, unspecified: C44.92

## 2021-01-28 MED ORDER — TRETINOIN 0.1 % EX CREA
TOPICAL_CREAM | Freq: Every day | CUTANEOUS | 2 refills | Status: AC
Start: 2021-01-28 — End: ?

## 2021-01-28 NOTE — Patient Instructions (Signed)

## 2021-01-28 NOTE — Progress Notes (Signed)
   Follow-Up Visit   Subjective  Denise Foster is a 70 y.o. female who presents for the following: Annual Exam (Here for annual skin exam. Concerns under belly she has a dark irritated lesion she is worried about. Also neck a lesion that wont heal. ).   The following portions of the chart were reviewed this encounter and updated as appropriate:  Tobacco  Allergies  Meds  Problems  Med Hx  Surg Hx  Fam Hx      Objective  Well appearing patient in no apparent distress; mood and affect are within normal limits.  A full examination was performed including scalp, head, eyes, ears, nose, lips, neck, chest, axillae, abdomen, back, buttocks, bilateral upper extremities, bilateral lower extremities, hands, feet, fingers, toes, fingernails, and toenails. All findings within normal limits unless otherwise noted below.  Neck - Anterior left Dark crust     Chest - Medial (Center) Pearly erythematous plaque.      Right Abdomen (side) - Lower     Head - Anterior (Face) Open comedomes and cysts.  Assessment & Plan  Neoplasm of uncertain behavior of skin (3) Neck - Anterior left  Skin / nail biopsy Type of biopsy: tangential   Informed consent: discussed and consent obtained   Timeout: patient name, date of birth, surgical site, and procedure verified   Anesthesia: the lesion was anesthetized in a standard fashion   Anesthetic:  1% lidocaine w/ epinephrine 1-100,000 local infiltration Instrument used: flexible razor blade   Hemostasis achieved with: ferric subsulfate   Outcome: patient tolerated procedure well   Post-procedure details: wound care instructions given    Specimen 1 - Surgical pathology Differential Diagnosis: scc vs bcc  Check Margins: No  Chest - Medial (Center)  Skin / nail biopsy Type of biopsy: tangential   Informed consent: discussed and consent obtained   Timeout: patient name, date of birth, surgical site, and procedure verified    Anesthesia: the lesion was anesthetized in a standard fashion   Anesthetic:  1% lidocaine w/ epinephrine 1-100,000 local infiltration Instrument used: flexible razor blade   Hemostasis achieved with: ferric subsulfate   Outcome: patient tolerated procedure well   Post-procedure details: wound care instructions given    Specimen 2 - Surgical pathology Differential Diagnosis: scc vs bcc  Check Margins: No  Right Abdomen (side) - Lower  Skin / nail biopsy Type of biopsy: tangential   Informed consent: discussed and consent obtained   Timeout: patient name, date of birth, surgical site, and procedure verified   Anesthesia: the lesion was anesthetized in a standard fashion   Anesthetic:  1% lidocaine w/ epinephrine 1-100,000 local infiltration Instrument used: flexible razor blade   Hemostasis achieved with: ferric subsulfate   Outcome: patient tolerated procedure well   Post-procedure details: wound care instructions given    Specimen 3 - Surgical pathology Differential Diagnosis: scc vs bcc, atypia   Check Margins: No  Acne, unspecified acne type Head - Anterior (Face)  Related Medications tretinoin (RETIN-A) 0.1 % cream Apply topically at bedtime.   I, Coran Dipaola, PA-C, have reviewed all documentation's for this visit.  The documentation on 01/28/21 for the exam, diagnosis, procedures and orders are all accurate and complete.

## 2021-02-04 ENCOUNTER — Telehealth: Payer: Self-pay | Admitting: *Deleted

## 2021-02-04 NOTE — Telephone Encounter (Signed)
-----   Message from Warren Danes, [place] sent at 02/03/2021  4:47 PM EDT ----- 30

## 2021-02-15 DIAGNOSIS — M545 Low back pain, unspecified: Secondary | ICD-10-CM | POA: Insufficient documentation

## 2021-02-15 DIAGNOSIS — F4310 Post-traumatic stress disorder, unspecified: Secondary | ICD-10-CM | POA: Insufficient documentation

## 2021-02-15 DIAGNOSIS — E1165 Type 2 diabetes mellitus with hyperglycemia: Secondary | ICD-10-CM | POA: Insufficient documentation

## 2021-02-15 DIAGNOSIS — J302 Other seasonal allergic rhinitis: Secondary | ICD-10-CM | POA: Insufficient documentation

## 2021-02-15 DIAGNOSIS — G8929 Other chronic pain: Secondary | ICD-10-CM | POA: Insufficient documentation

## 2021-02-15 DIAGNOSIS — M797 Fibromyalgia: Secondary | ICD-10-CM | POA: Insufficient documentation

## 2021-02-15 DIAGNOSIS — M459 Ankylosing spondylitis of unspecified sites in spine: Secondary | ICD-10-CM | POA: Insufficient documentation

## 2021-02-15 DIAGNOSIS — F909 Attention-deficit hyperactivity disorder, unspecified type: Secondary | ICD-10-CM | POA: Insufficient documentation

## 2021-05-20 ENCOUNTER — Encounter: Payer: Self-pay | Admitting: Physician Assistant

## 2021-05-20 ENCOUNTER — Ambulatory Visit (INDEPENDENT_AMBULATORY_CARE_PROVIDER_SITE_OTHER): Payer: Medicare HMO | Admitting: Physician Assistant

## 2021-05-20 ENCOUNTER — Other Ambulatory Visit: Payer: Self-pay

## 2021-05-20 DIAGNOSIS — D044 Carcinoma in situ of skin of scalp and neck: Secondary | ICD-10-CM

## 2021-05-20 NOTE — Patient Instructions (Signed)

## 2021-06-08 ENCOUNTER — Encounter: Payer: Self-pay | Admitting: Physician Assistant

## 2021-06-08 NOTE — Progress Notes (Signed)
° °  Follow-Up Visit   Subjective  Denise Foster is a 71 y.o. female who presents for the following: Procedure (Skin, neck- anterior left cis x 1).   The following portions of the chart were reviewed this encounter and updated as appropriate:  Tobacco   Allergies   Meds   Problems   Med Hx   Surg Hx   Fam Hx       Objective  Well appearing patient in no apparent distress; mood and affect are within normal limits.  A focused examination was performed including neck. Relevant physical exam findings are noted in the Assessment and Plan.  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  The documentation on 06/08/21 for the exam, diagnosis, procedures and orders are all accurate and complete.

## 2021-07-20 DIAGNOSIS — K5792 Diverticulitis of intestine, part unspecified, without perforation or abscess without bleeding: Secondary | ICD-10-CM | POA: Insufficient documentation

## 2021-07-20 DIAGNOSIS — M48061 Spinal stenosis, lumbar region without neurogenic claudication: Secondary | ICD-10-CM | POA: Insufficient documentation

## 2021-11-18 ENCOUNTER — Ambulatory Visit: Payer: Medicare HMO | Admitting: Physician Assistant

## 2021-11-18 ENCOUNTER — Encounter: Payer: Self-pay | Admitting: Physician Assistant

## 2021-11-18 DIAGNOSIS — D0462 Carcinoma in situ of skin of left upper limb, including shoulder: Secondary | ICD-10-CM

## 2021-11-18 DIAGNOSIS — D485 Neoplasm of uncertain behavior of skin: Secondary | ICD-10-CM

## 2021-11-18 DIAGNOSIS — C4492 Squamous cell carcinoma of skin, unspecified: Secondary | ICD-10-CM

## 2021-11-18 HISTORY — DX: Squamous cell carcinoma of skin, unspecified: C44.92

## 2021-11-18 NOTE — Patient Instructions (Signed)

## 2021-11-18 NOTE — Progress Notes (Signed)
   Follow-Up Visit   Subjective  Denise Foster is a 71 y.o. female who presents for the following: Follow-up (Previous visit cis on the neck healed out good, she wants scale around the neck checked and a lesion on the back she scratches but cant see).   The following portions of the chart were reviewed this encounter and updated as appropriate:  Tobacco  Allergies  Meds  Problems  Med Hx  Surg Hx  Fam Hx      Objective  Well appearing patient in no apparent distress; mood and affect are within normal limits.  All skin waist up examined.  Left Shoulder - Anterior Hyperkeratotic scale with pink base         Assessment & Plan  Neoplasm of uncertain behavior of skin Left Shoulder - Anterior  Skin / nail biopsy Type of biopsy: tangential   Informed consent: discussed and consent obtained   Timeout: patient name, date of birth, surgical site, and procedure verified   Anesthesia: the lesion was anesthetized in a standard fashion   Anesthetic:  1% lidocaine w/ epinephrine 1-100,000 local infiltration Instrument used: flexible razor blade   Hemostasis achieved with: aluminum chloride and electrodesiccation   Outcome: patient tolerated procedure well   Post-procedure details: sterile dressing applied and wound care instructions given   Dressing type: bandage and petrolatum    Destruction of lesion Complexity: simple   Destruction method: electrodesiccation and curettage   Informed consent: discussed and consent obtained   Timeout:  patient name, date of birth, surgical site, and procedure verified Anesthesia: the lesion was anesthetized in a standard fashion   Anesthetic:  1% lidocaine w/ epinephrine 1-100,000 local infiltration Curettage performed in three different directions: Yes   Electrodesiccation performed over the curetted area: Yes   Curettage cycles:  3 Margin per side (cm):  0.1 Final wound size (cm):  1.3 Hemostasis achieved with:  aluminum  chloride Outcome: patient tolerated procedure well with no complications   Post-procedure details: sterile dressing applied and wound care instructions given   Dressing type: bandage and petrolatum    Specimen 1 - Surgical pathology Differential Diagnosis: R/O BCC vs SCC - tx p bx  Check Margins: yes    I, Kavish Lafitte, PA-C, have reviewed all documentation's for this visit.  The documentation on 11/18/21 for the exam, diagnosis, procedures and orders are all accurate and complete.

## 2021-11-20 DIAGNOSIS — E669 Obesity, unspecified: Secondary | ICD-10-CM | POA: Insufficient documentation

## 2021-11-24 ENCOUNTER — Telehealth: Payer: Self-pay

## 2021-11-24 NOTE — Telephone Encounter (Signed)
Phone call to patient with her pathology results. Patient aware of results.  

## 2021-12-07 NOTE — Patient Instructions (Signed)
DUE TO COVID-19 ONLY TWO VISITORS  (aged 72 and older)  ARE ALLOWED TO COME WITH YOU AND STAY IN THE WAITING ROOM ONLY DURING PRE OP AND PROCEDURE.   **NO VISITORS ARE ALLOWED IN THE SHORT STAY AREA OR RECOVERY ROOM!!**  IF YOU WILL BE ADMITTED INTO THE HOSPITAL YOU ARE ALLOWED ONLY FOUR SUPPORT PEOPLE DURING VISITATION HOURS ONLY (7 AM -8PM)   The support person(s) must pass our screening, gel in and out, and wear a mask at all times, including in the patient's room. Patients must also wear a mask when staff or their support person are in the room. Visitors GUEST BADGE MUST BE WORN VISIBLY  One adult visitor may remain with you overnight and MUST be in the room by 8 P.M.     Your procedure is scheduled on: 12/21/21   Report to [facility] Main Entrance    Report to admitting at : 5:50 AM   Call this number if you have problems the morning of surgery [phone]   Do not eat food :After Midnight.   After Midnight you may have the following liquids until : 5:00 AM DAY OF SURGERY  Water Black Coffee (sugar ok, NO MILK/CREAM OR CREAMERS)  Tea (sugar ok, NO MILK/CREAM OR CREAMERS) regular and decaf                             Plain Jell-O (NO RED)                                           Fruit ices (not with fruit pulp, NO RED)                                     Popsicles (NO RED)                                                                  Juice: apple, WHITE grape, WHITE cranberry Sports drinks like Gatorade (NO RED) Clear broth(vegetable,chicken,beef)  Drink G2 drink AT : 5:00 AM the day of surgery.      The day of surgery:  Drink ONE (1) Pre-Surgery Clear Ensure or G2 at AM the morning of surgery. Drink in one sitting. Do not sip.  This drink was given to you during your hospital  pre-op appointment visit. Nothing else to drink after completing the  Pre-Surgery Clear Ensure or G2.          If you have questions, please contact your surgeon's office.    Oral  Hygiene is also important to reduce your risk of infection.                                    Remember - BRUSH YOUR TEETH THE MORNING OF SURGERY WITH YOUR REGULAR TOOTHPASTE   Do NOT smoke after Midnight   Take these medicines the morning of surgery with A SIP OF WATER: duloxetine.Tylenol,alprazolam as needed.  DO NOT  TAKE ANY ORAL DIABETIC MEDICATIONS DAY OF YOUR SURGERY  Bring CPAP mask and tubing day of surgery.                              You may not have any metal on your body including hair pins, jewelry, and body piercing             Do not wear make-up, lotions, powders, perfumes/cologne, or deodorant  Do not wear nail polish including gel and S&S, artificial/acrylic nails, or any other type of covering on natural nails including finger and toenails. If you have artificial nails, gel coating, etc. that needs to be removed by a nail salon please have this removed prior to surgery or surgery may need to be canceled/ delayed if the surgeon/ anesthesia feels like they are unable to be safely monitored.   Do not shave  48 hours prior to surgery.    Do not bring valuables to the hospital. [place].   Contacts, dentures or bridgework may not be worn into surgery.   Bring small overnight bag day of surgery.   DO NOT [place]. PHARMACY WILL DISPENSE MEDICATIONS LISTED ON YOUR MEDICATION LIST TO YOU DURING YOUR ADMISSION [place]!    Patients discharged on the day of surgery will not be allowed to drive home.  Someone NEEDS to stay with you for the first 24 hours after anesthesia.   Special Instructions: Bring a copy of your healthcare power of attorney and living will documents         the day of surgery if you haven't scanned them before.              Please read over the following fact sheets you were given: IF YOU HAVE QUESTIONS ABOUT YOUR PRE-OP INSTRUCTIONS PLEASE CALL [phone]      [facility] - Preparing for Surgery Before surgery, you can play an important role.  Because skin is not sterile, your skin needs to be as free of germs as possible.  You can reduce the number of germs on your skin by washing with CHG (chlorahexidine gluconate) soap before surgery.  CHG is an antiseptic cleaner which kills germs and bonds with the skin to continue killing germs even after washing. Please DO NOT use if you have an allergy to CHG or antibacterial soaps.  If your skin becomes reddened/irritated stop using the CHG and inform your nurse when you arrive at Short Stay. Do not shave (including legs and underarms) for at least 48 hours prior to the first CHG shower.  You may shave your face/neck. Please follow these instructions carefully:  1.  Shower with CHG Soap the night before surgery and the  morning of Surgery.  2.  If you choose to wash your hair, wash your hair first as usual with your  normal  shampoo.  3.  After you shampoo, rinse your hair and body thoroughly to remove the  shampoo.                           4.  Use CHG as you would any other liquid soap.  You can apply chg directly  to the skin and wash  Gently with a scrungie or clean washcloth.  5.  Apply the CHG Soap to your body ONLY FROM THE NECK DOWN.   Do not use on face/ open                           Wound or open sores. Avoid contact with eyes, ears mouth and genitals (private parts).                       Wash face,  Genitals (private parts) with your normal soap.             6.  Wash thoroughly, paying special attention to the area where your surgery  will be performed.  7.  Thoroughly rinse your body with warm water from the neck down.  8.  DO NOT shower/wash with your normal soap after using and rinsing off  the CHG Soap.                9.  Pat yourself dry with a clean towel.            10.  Wear clean pajamas.            11.  Place clean sheets on your bed the night of your first shower and  do not  sleep with pets. Day of Surgery : Do not apply any lotions/deodorants the morning of surgery.  Please wear clean clothes to the hospital/surgery center.  FAILURE TO FOLLOW THESE INSTRUCTIONS MAY RESULT IN THE CANCELLATION OF YOUR SURGERY PATIENT SIGNATURE_________________________________  NURSE SIGNATURE__________________________________  ________________________________________________________________________   Denise Foster  An incentive spirometer is a tool that can help keep your lungs clear and active. This tool measures how well you are filling your lungs with each breath. Taking long deep breaths may help reverse or decrease the chance of developing breathing (pulmonary) problems (especially infection) following: A long period of time when you are unable to move or be active. BEFORE THE PROCEDURE  If the spirometer includes an indicator to show your best effort, your nurse or respiratory therapist will set it to a desired goal. If possible, sit up straight or lean slightly forward. Try not to slouch. Hold the incentive spirometer in an upright position. INSTRUCTIONS FOR USE  Sit on the edge of your bed if possible, or sit up as far as you can in bed or on a chair. Hold the incentive spirometer in an upright position. Breathe out normally. Place the mouthpiece in your mouth and seal your lips tightly around it. Breathe in slowly and as deeply as possible, raising the piston or the ball toward the top of the column. Hold your breath for 3-5 seconds or for as long as possible. Allow the piston or ball to fall to the bottom of the column. Remove the mouthpiece from your mouth and breathe out normally. Rest for a few seconds and repeat Steps 1 through 7 at least 10 times every 1-2 hours when you are awake. Take your time and take a few normal breaths between deep breaths. The spirometer may include an indicator to show your best effort. Use the indicator as a goal to work  toward during each repetition. After each set of 10 deep breaths, practice coughing to be sure your lungs are clear. If you have an incision (the cut made at the time of surgery), support your incision when coughing by placing a pillow or rolled up towels  firmly against it. Once you are able to get out of bed, walk around indoors and cough well. You may stop using the incentive spirometer when instructed by your caregiver.  RISKS AND COMPLICATIONS Take your time so you do not get dizzy or light-headed. If you are in pain, you may need to take or ask for pain medication before doing incentive spirometry. It is harder to take a deep breath if you are having pain. AFTER USE Rest and breathe slowly and easily. It can be helpful to keep track of a log of your progress. Your caregiver can provide you with a simple table to help with this. If you are using the spirometer at home, follow these instructions: [place] IF:  You are having difficultly using the spirometer. You have trouble using the spirometer as often as instructed. Your pain medication is not giving enough relief while using the spirometer. You develop fever of 100.5 F (38.1 C) or higher. SEEK IMMEDIATE MEDICAL CARE IF:  You cough up bloody sputum that had not been present before. You develop fever of 102 F (38.9 C) or greater. You develop worsening pain at or near the incision site. MAKE SURE YOU:  Understand these instructions. Will watch your condition. Will get help right away if you are not doing well or get worse. Document Released: 09/27/2006 Document Revised: 08/09/2011 Document Reviewed: 11/28/2006 [facility] Patient Information 2014 [place], [place].   ________________________________________________________________________

## 2021-12-07 NOTE — H&P (Signed)
TOTAL KNEE ADMISSION H&P  Patient is being admitted for left total knee arthroplasty.  Subjective:  Chief Complaint: Left knee pain.  HPI: Denise Foster, 71 y.o. female has a history of pain and functional disability in the left knee due to arthritis and has failed non-surgical conservative treatments for greater than 12 weeks to include corticosteriod injections, use of assistive devices, and activity modification. Onset of symptoms was gradual, starting 8 years ago with gradually worsening course since that time. The patient noted no past surgery on the left knee.  Patient currently rates pain in the left knee at 8 out of 10 with activity. Patient has night pain, worsening of pain with activity and weight bearing, pain with passive range of motion, and crepitus. Patient has evidence of  severe bone-on-bone arthritis in the medial and patellofemoral compartments with lateral marginal osteophytes. Subchondral cystic changes and large osteophytes are also noted  by imaging studies. There is no active infection.  Patient Active Problem List   Diagnosis Date Noted   Spondylolisthesis, lumbar region 09/01/2020   Diverticulitis of colon 09/24/2019    Past Medical History:  Diagnosis Date   ADHD    Anxiety    Atypical mole 12/29/2018   atypical intradermal melanmocytic proliferation on left side chest - excision   Depression    Fibromyalgia    Hypercholesterolemia    Osteoarthritis    PONV (postoperative nausea and vomiting)    "could hear anything said" years ago   Pre-diabetes    PTSD (post-traumatic stress disorder)    SCCA (squamous cell carcinoma) of skin 11/18/2021   Left Shoulder Anterior (in situ) (tx p bx)   Sigmoid diverticulitis 08/2019   Spinal stenosis    Squamous cell carcinoma of skin 01/28/2021   in situ-neck-anterior left (CX35FU)    Past Surgical History:  Procedure Laterality Date   BREAST EXCISIONAL BIOPSY Left 1995   benign   CARPAL TUNNEL RELEASE      COLONOSCOPY  2005   Per patient, TCS in [place] with diverticulosis and small polyps   COLONOSCOPY WITH PROPOFOL N/A 02/18/2020   Procedure: COLONOSCOPY WITH PROPOFOL;  Surgeon: Daneil Dolin, MD;  [facility]: [facility];  Service: Endoscopy;  Laterality: N/A;  7:30am   PAROTIDECTOMY     POLYPECTOMY  02/18/2020   Procedure: POLYPECTOMY;  Surgeon: Daneil Dolin, MD;  Location: [facility] SUITE;  Service: Endoscopy;;   TONSILLECTOMY      Prior to Admission medications   Medication Sig Start Date End Date Taking? Authorizing Provider  acetaminophen (TYLENOL) 500 MG tablet Take 1,000 mg by mouth every 6 (six) hours as needed for moderate pain.   Yes [provider]  Alpha-Lipoic Acid 600 MG TABS Take 600 mg by mouth 2 (two) times daily.   Yes [provider]  ALPRAZolam Duanne Moron) 0.5 MG tablet Take 0.25-0.5 mg by mouth See admin instructions. Take 0.5 mg at bedtime, may take 0.25 to 0.5 mg once during the day as needed for anxiety   Yes [provider]  cetirizine (ZYRTEC) 10 MG tablet Take 10 mg by mouth at bedtime.   Yes [provider]  Cholecalciferol (VITAMIN D-3) 125 MCG (5000 UT) TABS Take 5,000 Units by mouth daily.   Yes [provider]  cyclobenzaprine (FLEXERIL) 10 MG tablet Take 1 tablet (10 mg total) by mouth daily as needed for muscle spasms. 09/02/20  Yes Newman Pies, MD  DULoxetine (CYMBALTA) 60 MG capsule Take 60 mg by mouth 2 (two)  times daily.   Yes [provider]  EPIPEN 2-PAK 0.3 MG/0.3ML SOAJ injection Inject 0.3 mg into the muscle as needed for anaphylaxis ((bee stings)).  05/14/19  Yes [provider]  ezetimibe (ZETIA) 10 MG tablet Take 10 mg by mouth at bedtime.  09/27/19  Yes [provider]  gabapentin (NEURONTIN) 600 MG tablet Take 600 mg by mouth at bedtime.   Yes [provider]  Magnesium 100 MG TABS Take 100 mg by mouth daily.   Yes [provider]  methylphenidate  (RITALIN) 20 MG tablet Take 20 mg by mouth in the morning and at bedtime.  12/20/19  Yes [provider]  Omega-3 Fatty Acids (FISH OIL) 1000 MG CAPS Take 1,000 mg by mouth at bedtime.   Yes [provider]  Propylene Glycol (SYSTANE BALANCE) 0.6 % SOLN Place 1 drop into both eyes in the morning, at noon, and at bedtime.   Yes [provider]  RESTASIS 0.05 % ophthalmic emulsion Place 1 drop into both eyes 2 (two) times daily.  09/29/19  Yes [provider]  rosuvastatin (CRESTOR) 10 MG tablet Take 10 mg by mouth at bedtime.   Yes [provider]  Sodium Fluoride (CLINPRO 5000) 1.1 % PSTE Place 1 application onto teeth at bedtime.   Yes [provider]  tretinoin (RETIN-A) 0.1 % cream Apply topically at bedtime. Patient taking differently: Apply 1 Application topically at bedtime. 01/28/21  Yes Sheffield, Kelli R, PA-C  zinc gluconate 50 MG tablet Take 50 mg by mouth daily.   Yes [provider]    Allergies  Allergen Reactions   Bee Venom Anaphylaxis    BEE STINGS   Sulfa Antibiotics Other (See Comments)    Blisters/ulcers.    Social History   Socioeconomic History   Marital status: Divorced    Spouse name: Not on file   Number of children: Not on file   Years of education: Not on file   Highest education level: Not on file  Occupational History   Not on file  Tobacco Use   Smoking status: Never   Smokeless tobacco: Never  Vaping Use   Vaping Use: Never used  Substance and Sexual Activity   Alcohol use: Yes    Comment: rarely    Drug use: Never   Sexual activity: Not on file  Other Topics Concern   Not on file  Social History Narrative   Not on file   Social Determinants of Health   Financial Resource Strain: Not on file  Food Insecurity: Not on file  Transportation Needs: Not on file  Physical Activity: Not on file  Stress: Not on file  Social Connections: Not on file  Intimate Partner Violence: Not on  file    Tobacco Use: Low Risk  (11/24/2021)   Patient History    Smoking Tobacco Use: Never    Smokeless Tobacco Use: Never    Passive Exposure: Not on file   Social History   Substance and Sexual Activity  Alcohol Use Yes   Comment: rarely     Family History  Problem Relation Age of Onset   Breast cancer Neg Hx    Colon cancer Neg Hx     Review of Systems  Constitutional:  Negative for chills and fever.  HENT:  Negative for congestion, sore throat and tinnitus.   Eyes:  Negative for double vision, photophobia and pain.  Respiratory:  Negative for cough, shortness of breath and wheezing.  Cardiovascular:  Negative for chest pain, palpitations and orthopnea.  Gastrointestinal:  Negative for heartburn, nausea and vomiting.  Genitourinary:  Negative for dysuria, frequency and urgency.  Musculoskeletal:  Positive for joint pain.  Neurological:  Negative for dizziness, weakness and headaches.    Objective:  Physical Exam: Well nourished and well developed.  General: Alert and oriented x3, cooperative and pleasant, no acute distress.  Head: normocephalic, atraumatic, neck supple.  Eyes: EOMI.  Musculoskeletal:  Left knee exam:  Slight varus deformity with no effusion  ROM 0-130 with marked crepitus  Tender medial greater than lateral  Mild varus/valgus laxity with no AP laxity  Calves soft and nontender. Motor function intact in LE. Strength 5/5 LE bilaterally. Neuro: Distal pulses 2+. Sensation to light touch intact in LE.  Imaging Review Plain radiographs demonstrate severe degenerative joint disease of the left knee. The overall alignment is mild varus. The bone quality appears to be adequate for age and reported activity level.  Assessment/Plan:  End stage arthritis, left knee   The patient history, physical examination, clinical judgment of the provider and imaging studies are consistent with end stage degenerative joint disease of the left knee and total  knee arthroplasty is deemed medically necessary. The treatment options including medical management, injection therapy arthroscopy and arthroplasty were discussed at length. The risks and benefits of total knee arthroplasty were presented and reviewed. The risks due to aseptic loosening, infection, stiffness, patella tracking problems, thromboembolic complications and other imponderables were discussed. The patient acknowledged the explanation, agreed to proceed with the plan and consent was signed. Patient is being admitted for inpatient treatment for surgery, pain control, PT, OT, prophylactic antibiotics, VTE prophylaxis, progressive ambulation and ADLs and discharge planning. The patient is planning to be discharged  home .   Patient's anticipated LOS is less than 2 midnights, meeting these requirements: - Lives within 1 hour of care - Has a competent adult at home to recover with post-op recover - NO history of  - Chronic pain requiring opioids  - Diabetes  - Coronary Artery Disease  - Heart failure  - Heart attack  - Stroke  - DVT/VTE  - Cardiac arrhythmia  - Respiratory Failure/COPD  - Renal failure  - Anemia  - Advanced Liver disease  Therapy Plans: Outpatient therapy at [facility]) Disposition: Home with friend Planned DVT Prophylaxis: Aspirin 325 mg BID DME Needed: None PCP: Delphina Cahill, MD TXA: IV Allergies: Sulfa Anesthesia Concerns: None BMI: 39.8 Last HgbA1c: Not diabetic.  Pharmacy: [facility])  Other: Etta Grandchild (FNP under Dr. Nevada Crane) in 07/2021 and was cleared for surgery per patient. Taking clearance form to his office today. - Tolerated oxycodone with previous lumbar surgery  - Patient was instructed on what medications to stop prior to surgery. - Follow-up visit in 2 weeks with Dr. Wynelle Link - Begin physical therapy following surgery - Pre-operative lab work as pre-surgical testing - Prescriptions will be provided in hospital at time of  discharge  Theresa Duty, PA-C Orthopedic Surgery [facility] [place]

## 2021-12-08 ENCOUNTER — Encounter (HOSPITAL_COMMUNITY): Payer: Self-pay

## 2021-12-08 ENCOUNTER — Other Ambulatory Visit: Payer: Self-pay

## 2021-12-08 ENCOUNTER — Encounter (HOSPITAL_COMMUNITY)
Admission: RE | Admit: 2021-12-08 | Discharge: 2021-12-08 | Disposition: A | Discharge status: Home / Self Care | Payer: Medicare HMO | Source: Ambulatory Visit | Attending: Orthopedic Surgery | Admitting: Orthopedic Surgery

## 2021-12-08 VITALS — BP 135/85 | HR 71 | Temp 98.1°F | Resp 18 | Ht <= 58 in | Wt 183.5 lb

## 2021-12-08 DIAGNOSIS — R7303 Prediabetes: Secondary | ICD-10-CM | POA: Diagnosis not present

## 2021-12-08 DIAGNOSIS — Z01818 Encounter for other preprocedural examination: Secondary | ICD-10-CM

## 2021-12-08 DIAGNOSIS — M1712 Unilateral primary osteoarthritis, left knee: Secondary | ICD-10-CM | POA: Insufficient documentation

## 2021-12-08 LAB — CBC
HCT: 45.6 % (ref 36.0–46.0)
Hemoglobin: 14.3 g/dL (ref 12.0–15.0)
MCH: 30 pg (ref 26.0–34.0)
MCHC: 31.4 g/dL (ref 30.0–36.0)
MCV: 95.6 fL (ref 80.0–100.0)
Platelets: 357 10*3/uL (ref 150–400)
RBC: 4.77 MIL/uL (ref 3.87–5.11)
RDW: 13.8 % (ref 11.5–15.5)
WBC: 13 10*3/uL — ABNORMAL HIGH (ref 4.0–10.5)
nRBC: 0 % (ref 0.0–0.2)

## 2021-12-08 LAB — GLUCOSE, CAPILLARY: Glucose-Capillary: 132 mg/dL — ABNORMAL HIGH (ref 70–99)

## 2021-12-08 LAB — SURGICAL PCR SCREEN
MRSA, PCR: NEGATIVE
Staphylococcus aureus: NEGATIVE

## 2021-12-08 LAB — HEMOGLOBIN A1C
Hgb A1c MFr Bld: 6 % — ABNORMAL HIGH (ref 4.8–5.6)
Mean Plasma Glucose: 125.5 mg/dL

## 2021-12-08 NOTE — Progress Notes (Signed)
For Short Stay: [place] appointment date: Date of COVID positive in last 59 days:  Bowel Prep reminder:   For Anesthesia: PCP - Dr. Allyn Kenner Cardiologist -   Chest x-ray -  EKG -  Stress Test -  ECHO -  Cardiac Cath -  Pacemaker/ICD device last checked: Pacemaker orders received: Device Rep notified:  Spinal Cord Stimulator:  Sleep Study -  CPAP -   Fasting Blood Sugar - N/A Checks Blood Sugar __0___ times a day Date and result of last Hgb A1c- 6.1 : 07/2021  Blood Thinner Instructions: Aspirin Instructions: Last Dose:  Activity level: Can go up a flight of stairs and activities of daily living without stopping and without chest pain and/or shortness of breath   Able to exercise without chest pain and/or shortness of breath   Unable to go up a flight of stairs without chest pain and/or shortness of breath     Anesthesia review: Hx: Pre-DIA  Patient denies shortness of breath, fever, cough and chest pain at PAT appointment   Patient verbalized understanding of instructions that were given to them at the PAT appointment. Patient was also instructed that they will need to review over the PAT instructions again at home before surgery.

## 2021-12-20 NOTE — Anesthesia Preprocedure Evaluation (Addendum)
Anesthesia Evaluation  Patient identified by MRN, date of birth, ID band Patient awake    Reviewed: Allergy & Precautions, NPO status , Patient's Chart, lab work & pertinent test results  History of Anesthesia Complications Negative for: history of anesthetic complications  Airway Mallampati: II  TM Distance: >3 FB Neck ROM: Full    Dental   Pulmonary neg pulmonary ROS,    Pulmonary exam normal        Cardiovascular negative cardio ROS Normal cardiovascular exam     Neuro/Psych Anxiety Depression negative neurological ROS     GI/Hepatic negative GI ROS, Neg liver ROS,   Endo/Other  negative endocrine ROS  Renal/GU negative Renal ROS  negative genitourinary   Musculoskeletal  (+) Arthritis , Osteoarthritis,  Fibromyalgia -  Abdominal   Peds  Hematology negative hematology ROS (+)   Anesthesia Other Findings Plts 357  Reproductive/Obstetrics                            Anesthesia Physical Anesthesia Plan  ASA: 2  Anesthesia Plan: Spinal   Post-op Pain Management: Regional block*, Tylenol PO (pre-op)* and Toradol IV (intra-op)*   Induction:   PONV Risk Score and Plan: 2 and Propofol infusion, Treatment may vary due to age or medical condition, Ondansetron and TIVA  Airway Management Planned: Nasal Cannula and Simple Face Mask  Additional Equipment: None  Intra-op Plan:   Post-operative Plan:   Informed Consent: I have reviewed the patients History and Physical, chart, labs and discussed the procedure including the risks, benefits and alternatives for the proposed anesthesia with the patient or authorized representative who has indicated his/her understanding and acceptance.       Plan Discussed with:   Anesthesia Plan Comments:        Anesthesia Quick Evaluation

## 2021-12-21 ENCOUNTER — Encounter (HOSPITAL_COMMUNITY): Payer: Self-pay | Admitting: Orthopedic Surgery

## 2021-12-21 ENCOUNTER — Ambulatory Visit (HOSPITAL_BASED_OUTPATIENT_CLINIC_OR_DEPARTMENT_OTHER): Payer: Medicare HMO | Admitting: Anesthesiology

## 2021-12-21 ENCOUNTER — Other Ambulatory Visit: Payer: Self-pay

## 2021-12-21 ENCOUNTER — Ambulatory Visit (HOSPITAL_COMMUNITY): Payer: Medicare HMO | Admitting: Anesthesiology

## 2021-12-21 ENCOUNTER — Encounter (HOSPITAL_COMMUNITY)
Admission: RE | Disposition: A | Discharge status: Home / Self Care | Payer: Self-pay | Source: Home / Self Care | Attending: Orthopedic Surgery

## 2021-12-21 ENCOUNTER — Observation Stay (HOSPITAL_COMMUNITY)
Admission: RE | Admit: 2021-12-21 | Discharge: 2021-12-22 | Disposition: A | Discharge status: Home / Self Care | Payer: Medicare HMO | Attending: Orthopedic Surgery | Admitting: Orthopedic Surgery

## 2021-12-21 DIAGNOSIS — Z85828 Personal history of other malignant neoplasm of skin: Secondary | ICD-10-CM | POA: Diagnosis not present

## 2021-12-21 DIAGNOSIS — Z79899 Other long term (current) drug therapy: Secondary | ICD-10-CM | POA: Diagnosis not present

## 2021-12-21 DIAGNOSIS — M1712 Unilateral primary osteoarthritis, left knee: Secondary | ICD-10-CM | POA: Diagnosis present

## 2021-12-21 DIAGNOSIS — M179 Osteoarthritis of knee, unspecified: Secondary | ICD-10-CM | POA: Diagnosis present

## 2021-12-21 HISTORY — PX: TOTAL KNEE ARTHROPLASTY: SHX125

## 2021-12-21 SURGERY — ARTHROPLASTY, KNEE, TOTAL
Anesthesia: Spinal | Site: Knee | Laterality: Left

## 2021-12-21 MED ORDER — ACETAMINOPHEN 10 MG/ML IV SOLN
1000.0000 mg | Freq: Four times a day (QID) | INTRAVENOUS | Status: DC
  Filled 2021-12-21: qty 100

## 2021-12-21 MED ORDER — SODIUM CHLORIDE 0.9 % IR SOLN
Status: DC | PRN
  Administered 2021-12-21: 1000 mL

## 2021-12-21 MED ORDER — POLYETHYLENE GLYCOL 3350 17 G PO PACK: 17.0000 g | PACK | Freq: Every day | ORAL | Status: DC | PRN

## 2021-12-21 MED ORDER — ONDANSETRON HCL 4 MG/2ML IJ SOLN
INTRAMUSCULAR | Status: AC
  Filled 2021-12-21: qty 2

## 2021-12-21 MED ORDER — OXYCODONE HCL 5 MG/5ML PO SOLN: 5.0000 mg | Freq: Once | ORAL | Status: DC | PRN

## 2021-12-21 MED ORDER — BUPIVACAINE LIPOSOME 1.3 % IJ SUSP
INTRAMUSCULAR | Status: DC | PRN
  Administered 2021-12-21: 20 mL

## 2021-12-21 MED ORDER — METHOCARBAMOL 500 MG PO TABS
500.0000 mg | ORAL_TABLET | Freq: Four times a day (QID) | ORAL | Status: DC | PRN
  Administered 2021-12-21 – 2021-12-22 (×3): 500 mg via ORAL
  Filled 2021-12-21 (×3): qty 1

## 2021-12-21 MED ORDER — PROPYLENE GLYCOL 0.6 % OP SOLN
1.0000 [drp] | Freq: Three times a day (TID) | OPHTHALMIC | Status: DC
Start: 2021-12-21 — End: 2021-12-21

## 2021-12-21 MED ORDER — ROSUVASTATIN CALCIUM 10 MG PO TABS: 10.0000 mg | ORAL_TABLET | Freq: Every day | ORAL | Status: DC

## 2021-12-21 MED ORDER — DEXAMETHASONE SODIUM PHOSPHATE 10 MG/ML IJ SOLN
8.0000 mg | Freq: Once | INTRAMUSCULAR | Status: AC
  Administered 2021-12-21: 8 mg via INTRAVENOUS

## 2021-12-21 MED ORDER — BUPIVACAINE IN DEXTROSE 0.75-8.25 % IT SOLN
INTRATHECAL | Status: DC | PRN
  Administered 2021-12-21: 1.6 mL via INTRATHECAL

## 2021-12-21 MED ORDER — LORATADINE 10 MG PO TABS
10.0000 mg | ORAL_TABLET | Freq: Every day | ORAL | Status: DC
  Administered 2021-12-22: 10 mg via ORAL
  Filled 2021-12-21: qty 1

## 2021-12-21 MED ORDER — ALPRAZOLAM 0.25 MG PO TABS: 0.2500 mg | ORAL_TABLET | Freq: Every day | ORAL | Status: DC | PRN

## 2021-12-21 MED ORDER — PHENYLEPHRINE 80 MCG/ML (10ML) SYRINGE FOR IV PUSH (FOR BLOOD PRESSURE SUPPORT)
PREFILLED_SYRINGE | INTRAVENOUS | Status: AC
  Filled 2021-12-21: qty 10

## 2021-12-21 MED ORDER — ONDANSETRON HCL 4 MG/2ML IJ SOLN: 4.0000 mg | Freq: Four times a day (QID) | INTRAMUSCULAR | Status: DC | PRN

## 2021-12-21 MED ORDER — LACTATED RINGERS IV SOLN: INTRAVENOUS | Status: DC

## 2021-12-21 MED ORDER — ALPRAZOLAM 0.25 MG PO TABS: 0.2500 mg | ORAL_TABLET | ORAL | Status: DC

## 2021-12-21 MED ORDER — METHOCARBAMOL 500 MG IVPB - SIMPLE MED: 500.0000 mg | Freq: Four times a day (QID) | INTRAVENOUS | Status: DC | PRN

## 2021-12-21 MED ORDER — POVIDONE-IODINE 10 % EX SWAB
2.0000 | Freq: Once | CUTANEOUS | Status: AC
  Administered 2021-12-21: 2 via TOPICAL

## 2021-12-21 MED ORDER — METOCLOPRAMIDE HCL 5 MG PO TABS: 5.0000 mg | ORAL_TABLET | Freq: Three times a day (TID) | ORAL | Status: DC | PRN

## 2021-12-21 MED ORDER — GABAPENTIN 300 MG PO CAPS
600.0000 mg | ORAL_CAPSULE | Freq: Every day | ORAL | Status: DC
  Administered 2021-12-21: 600 mg via ORAL
  Filled 2021-12-21: qty 2

## 2021-12-21 MED ORDER — DOCUSATE SODIUM 100 MG PO CAPS
100.0000 mg | ORAL_CAPSULE | Freq: Two times a day (BID) | ORAL | Status: DC
  Administered 2021-12-21 – 2021-12-22 (×2): 100 mg via ORAL
  Filled 2021-12-21 (×2): qty 1

## 2021-12-21 MED ORDER — MIDAZOLAM HCL 2 MG/2ML IJ SOLN
1.0000 mg | Freq: Once | INTRAMUSCULAR | Status: AC
  Administered 2021-12-21: 2 mg via INTRAVENOUS
  Filled 2021-12-21: qty 2

## 2021-12-21 MED ORDER — ASPIRIN 325 MG PO TBEC
325.0000 mg | DELAYED_RELEASE_TABLET | Freq: Two times a day (BID) | ORAL | Status: DC
  Administered 2021-12-22: 325 mg via ORAL
  Filled 2021-12-21: qty 1

## 2021-12-21 MED ORDER — DIPHENHYDRAMINE HCL 12.5 MG/5ML PO ELIX: 12.5000 mg | ORAL_SOLUTION | ORAL | Status: DC | PRN

## 2021-12-21 MED ORDER — CHLORHEXIDINE GLUCONATE 0.12 % MT SOLN
15.0000 mL | Freq: Once | OROMUCOSAL | Status: AC
  Administered 2021-12-21: 15 mL via OROMUCOSAL

## 2021-12-21 MED ORDER — FLEET ENEMA 7-19 GM/118ML RE ENEM: 1.0000 | ENEMA | Freq: Once | RECTAL | Status: DC | PRN

## 2021-12-21 MED ORDER — ROPIVACAINE HCL 5 MG/ML IJ SOLN
INTRAMUSCULAR | Status: DC | PRN
  Administered 2021-12-21: 30 mL via PERINEURAL

## 2021-12-21 MED ORDER — OXYCODONE HCL 5 MG PO TABS
10.0000 mg | ORAL_TABLET | ORAL | Status: DC | PRN
  Administered 2021-12-21 – 2021-12-22 (×3): 10 mg via ORAL
  Filled 2021-12-21 (×4): qty 2

## 2021-12-21 MED ORDER — OXYCODONE HCL 5 MG PO TABS
5.0000 mg | ORAL_TABLET | ORAL | Status: DC | PRN
  Administered 2021-12-21 (×2): 5 mg via ORAL
  Filled 2021-12-21 (×3): qty 1

## 2021-12-21 MED ORDER — TRANEXAMIC ACID-NACL 1000-0.7 MG/100ML-% IV SOLN
1000.0000 mg | INTRAVENOUS | Status: AC
  Administered 2021-12-21: 1000 mg via INTRAVENOUS
  Filled 2021-12-21: qty 100

## 2021-12-21 MED ORDER — PROPOFOL 500 MG/50ML IV EMUL
INTRAVENOUS | Status: AC
Start: 2021-12-21 — End: ?
  Filled 2021-12-21: qty 50

## 2021-12-21 MED ORDER — MORPHINE SULFATE (PF) 2 MG/ML IV SOLN: 1.0000 mg | INTRAVENOUS | Status: DC | PRN

## 2021-12-21 MED ORDER — AMISULPRIDE (ANTIEMETIC) 5 MG/2ML IV SOLN: 10.0000 mg | Freq: Once | INTRAVENOUS | Status: DC | PRN

## 2021-12-21 MED ORDER — SODIUM CHLORIDE (PF) 0.9 % IJ SOLN
INTRAMUSCULAR | Status: AC
Start: 2021-12-21 — End: ?
  Filled 2021-12-21: qty 50

## 2021-12-21 MED ORDER — FENTANYL CITRATE PF 50 MCG/ML IJ SOSY
50.0000 ug | PREFILLED_SYRINGE | Freq: Once | INTRAMUSCULAR | Status: AC
  Administered 2021-12-21: 50 ug via INTRAVENOUS
  Filled 2021-12-21: qty 2

## 2021-12-21 MED ORDER — PROPOFOL 10 MG/ML IV BOLUS
INTRAVENOUS | Status: AC
  Filled 2021-12-21: qty 20

## 2021-12-21 MED ORDER — KETOROLAC TROMETHAMINE 30 MG/ML IJ SOLN
INTRAMUSCULAR | Status: AC
  Filled 2021-12-21: qty 1

## 2021-12-21 MED ORDER — PHENOL 1.4 % MT LIQD: 1.0000 | OROMUCOSAL | Status: DC | PRN

## 2021-12-21 MED ORDER — CYCLOSPORINE 0.05 % OP EMUL
1.0000 [drp] | Freq: Two times a day (BID) | OPHTHALMIC | Status: DC
Start: 2021-12-21 — End: 2021-12-22
  Administered 2021-12-21 – 2021-12-22 (×2): 1 [drp] via OPHTHALMIC
  Filled 2021-12-21 (×2): qty 30

## 2021-12-21 MED ORDER — DEXAMETHASONE SODIUM PHOSPHATE 10 MG/ML IJ SOLN
INTRAMUSCULAR | Status: AC
  Filled 2021-12-21: qty 1

## 2021-12-21 MED ORDER — PROPOFOL 500 MG/50ML IV EMUL
INTRAVENOUS | Status: DC | PRN
  Administered 2021-12-21: 100 ug/kg/min via INTRAVENOUS

## 2021-12-21 MED ORDER — POLYVINYL ALCOHOL 1.4 % OP SOLN
1.0000 [drp] | Freq: Three times a day (TID) | OPHTHALMIC | Status: DC
  Administered 2021-12-21 – 2021-12-22 (×3): 1 [drp] via OPHTHALMIC
  Filled 2021-12-21: qty 15

## 2021-12-21 MED ORDER — FENTANYL CITRATE PF 50 MCG/ML IJ SOSY: 25.0000 ug | PREFILLED_SYRINGE | INTRAMUSCULAR | Status: DC | PRN

## 2021-12-21 MED ORDER — METOCLOPRAMIDE HCL 5 MG/ML IJ SOLN: 5.0000 mg | Freq: Three times a day (TID) | INTRAMUSCULAR | Status: DC | PRN

## 2021-12-21 MED ORDER — BUPIVACAINE LIPOSOME 1.3 % IJ SUSP
INTRAMUSCULAR | Status: AC
  Filled 2021-12-21: qty 20

## 2021-12-21 MED ORDER — PHENYLEPHRINE HCL-NACL 20-0.9 MG/250ML-% IV SOLN
INTRAVENOUS | Status: AC
  Filled 2021-12-21: qty 250

## 2021-12-21 MED ORDER — LIDOCAINE HCL (PF) 2 % IJ SOLN
INTRAMUSCULAR | Status: AC
  Filled 2021-12-21: qty 5

## 2021-12-21 MED ORDER — PHENYLEPHRINE HCL (PRESSORS) 10 MG/ML IV SOLN
INTRAVENOUS | Status: DC | PRN
  Administered 2021-12-21: 80 ug via INTRAVENOUS
  Administered 2021-12-21: 160 ug via INTRAVENOUS

## 2021-12-21 MED ORDER — DULOXETINE HCL 60 MG PO CPEP
60.0000 mg | ORAL_CAPSULE | Freq: Two times a day (BID) | ORAL | Status: DC
  Administered 2021-12-21 – 2021-12-22 (×2): 60 mg via ORAL
  Filled 2021-12-21 (×2): qty 1

## 2021-12-21 MED ORDER — PROPOFOL 10 MG/ML IV BOLUS
INTRAVENOUS | Status: DC | PRN
  Administered 2021-12-21: 20 mg via INTRAVENOUS

## 2021-12-21 MED ORDER — DEXAMETHASONE SODIUM PHOSPHATE 10 MG/ML IJ SOLN
10.0000 mg | Freq: Once | INTRAMUSCULAR | Status: AC
  Administered 2021-12-22: 10 mg via INTRAVENOUS
  Filled 2021-12-21: qty 1

## 2021-12-21 MED ORDER — OXYCODONE HCL 5 MG PO TABS: 5.0000 mg | ORAL_TABLET | Freq: Once | ORAL | Status: DC | PRN

## 2021-12-21 MED ORDER — ONDANSETRON HCL 4 MG/2ML IJ SOLN: 4.0000 mg | Freq: Once | INTRAMUSCULAR | Status: DC | PRN

## 2021-12-21 MED ORDER — POVIDONE-IODINE 10 % EX SWAB: 2.0000 "application " | Freq: Once | CUTANEOUS | Status: DC

## 2021-12-21 MED ORDER — METHYLPHENIDATE HCL 20 MG PO TABS
20.0000 mg | ORAL_TABLET | Freq: Two times a day (BID) | ORAL | Status: DC
  Filled 2021-12-21: qty 1

## 2021-12-21 MED ORDER — ACETAMINOPHEN 500 MG PO TABS
1000.0000 mg | ORAL_TABLET | Freq: Four times a day (QID) | ORAL | Status: AC
  Administered 2021-12-21 – 2021-12-22 (×4): 1000 mg via ORAL
  Filled 2021-12-21 (×4): qty 2

## 2021-12-21 MED ORDER — SODIUM CHLORIDE (PF) 0.9 % IJ SOLN
INTRAMUSCULAR | Status: AC
  Filled 2021-12-21: qty 10

## 2021-12-21 MED ORDER — ACETAMINOPHEN 500 MG PO TABS: 1000.0000 mg | ORAL_TABLET | Freq: Once | ORAL | Status: DC

## 2021-12-21 MED ORDER — CEFAZOLIN SODIUM-DEXTROSE 2-4 GM/100ML-% IV SOLN
2.0000 g | Freq: Four times a day (QID) | INTRAVENOUS | Status: AC
  Administered 2021-12-21 (×2): 2 g via INTRAVENOUS
  Filled 2021-12-21 (×2): qty 100

## 2021-12-21 MED ORDER — ONDANSETRON HCL 4 MG PO TABS: 4.0000 mg | ORAL_TABLET | Freq: Four times a day (QID) | ORAL | Status: DC | PRN

## 2021-12-21 MED ORDER — KETOROLAC TROMETHAMINE 30 MG/ML IJ SOLN
INTRAMUSCULAR | Status: DC | PRN
  Administered 2021-12-21: 30 mg via INTRAVENOUS

## 2021-12-21 MED ORDER — SODIUM CHLORIDE 0.9 % IV SOLN: INTRAVENOUS | Status: DC

## 2021-12-21 MED ORDER — SODIUM CHLORIDE (PF) 0.9 % IJ SOLN
INTRAMUSCULAR | Status: DC | PRN
  Administered 2021-12-21: 60 mL via INTRAVENOUS

## 2021-12-21 MED ORDER — ORAL CARE MOUTH RINSE: 15.0000 mL | Freq: Once | OROMUCOSAL | Status: AC

## 2021-12-21 MED ORDER — MENTHOL 3 MG MT LOZG: 1.0000 | LOZENGE | OROMUCOSAL | Status: DC | PRN

## 2021-12-21 MED ORDER — ONDANSETRON HCL 4 MG/2ML IJ SOLN
INTRAMUSCULAR | Status: DC | PRN
  Administered 2021-12-21: 4 mg via INTRAVENOUS

## 2021-12-21 MED ORDER — BISACODYL 10 MG RE SUPP: 10.0000 mg | Freq: Every day | RECTAL | Status: DC | PRN

## 2021-12-21 MED ORDER — ALPRAZOLAM 0.5 MG PO TABS
0.5000 mg | ORAL_TABLET | Freq: Every day | ORAL | Status: DC
Start: 2021-12-21 — End: 2021-12-22
  Filled 2021-12-21: qty 1

## 2021-12-21 MED ORDER — BUPIVACAINE LIPOSOME 1.3 % IJ SUSP: 20.0000 mL | Freq: Once | INTRAMUSCULAR | Status: DC

## 2021-12-21 MED ORDER — PROPOFOL 500 MG/50ML IV EMUL
INTRAVENOUS | Status: AC
  Filled 2021-12-21: qty 50

## 2021-12-21 MED ORDER — EZETIMIBE 10 MG PO TABS
10.0000 mg | ORAL_TABLET | Freq: Every day | ORAL | Status: DC
Start: 2021-12-22 — End: 2021-12-22

## 2021-12-21 MED ORDER — CEFAZOLIN SODIUM-DEXTROSE 2-4 GM/100ML-% IV SOLN
2.0000 g | INTRAVENOUS | Status: AC
  Administered 2021-12-21: 2 g via INTRAVENOUS
  Filled 2021-12-21: qty 100

## 2021-12-21 SURGICAL SUPPLY — 60 items
ATTUNE PS FEM LT SZ 4 CEM KNEE (Femur) ×1 IMPLANT
ATTUNE PSRP INSR SZ4 7 KNEE (Insert) ×1 IMPLANT
BAG COUNTER SPONGE SURGICOUNT (BAG) IMPLANT
BAG SPEC THK2 15X12 ZIP CLS (MISCELLANEOUS) ×1
BAG SPNG CNTER NS LX DISP (BAG)
BAG ZIPLOCK 12X15 (MISCELLANEOUS) ×3 IMPLANT
BASEPLATE TIBIAL ROTATING SZ 4 (Knees) ×1 IMPLANT
BLADE SAG 18X100X1.27 (BLADE) ×3 IMPLANT
BLADE SAW SGTL 11.0X1.19X90.0M (BLADE) ×3 IMPLANT
BNDG ELASTIC 6X5.8 VLCR STR LF (GAUZE/BANDAGES/DRESSINGS) ×3 IMPLANT
BOWL SMART MIX CTS (DISPOSABLE) ×3 IMPLANT
BSPLAT TIB 4 CMNT ROT PLAT STR (Knees) ×1 IMPLANT
CEMENT HV SMART SET (Cement) ×6 IMPLANT
CLSR STERI-STRIP ANTIMIC 1/2X4 (GAUZE/BANDAGES/DRESSINGS) ×1 IMPLANT
COVER SURGICAL LIGHT HANDLE (MISCELLANEOUS) ×3 IMPLANT
CUFF TOURN SGL QUICK 34 (TOURNIQUET CUFF) ×2
CUFF TRNQT CYL 34X4.125X (TOURNIQUET CUFF) ×2 IMPLANT
DRAPE INCISE IOBAN 66X45 STRL (DRAPES) ×3 IMPLANT
DRAPE U-SHAPE 47X51 STRL (DRAPES) ×3 IMPLANT
DRSG AQUACEL AG ADV 3.5X10 (GAUZE/BANDAGES/DRESSINGS) ×3 IMPLANT
DURAPREP 26ML APPLICATOR (WOUND CARE) ×3 IMPLANT
ELECT REM PT RETURN 15FT ADLT (MISCELLANEOUS) ×3 IMPLANT
GLOVE BIO SURGEON STRL SZ 6.5 (GLOVE) IMPLANT
GLOVE BIO SURGEON STRL SZ7.5 (GLOVE) IMPLANT
GLOVE BIO SURGEON STRL SZ8 (GLOVE) ×3 IMPLANT
GLOVE BIOGEL PI IND STRL 6.5 (GLOVE) IMPLANT
GLOVE BIOGEL PI IND STRL 7.0 (GLOVE) IMPLANT
GLOVE BIOGEL PI IND STRL 8 (GLOVE) ×2 IMPLANT
GLOVE BIOGEL PI INDICATOR 6.5 (GLOVE)
GLOVE BIOGEL PI INDICATOR 7.0 (GLOVE)
GLOVE BIOGEL PI INDICATOR 8 (GLOVE) ×1
GOWN STRL REUS W/ TWL LRG LVL3 (GOWN DISPOSABLE) ×2 IMPLANT
GOWN STRL REUS W/ TWL XL LVL3 (GOWN DISPOSABLE) IMPLANT
GOWN STRL REUS W/TWL LRG LVL3 (GOWN DISPOSABLE) ×2
GOWN STRL REUS W/TWL XL LVL3 (GOWN DISPOSABLE)
HANDPIECE INTERPULSE COAX TIP (DISPOSABLE) ×2
HOLDER FOLEY CATH W/STRAP (MISCELLANEOUS) IMPLANT
IMMOBILIZER KNEE 20 (SOFTGOODS) ×2
IMMOBILIZER KNEE 20 THIGH 36 (SOFTGOODS) ×2 IMPLANT
KIT TURNOVER KIT A (KITS) IMPLANT
MANIFOLD NEPTUNE II (INSTRUMENTS) ×3 IMPLANT
NS IRRIG 1000ML POUR BTL (IV SOLUTION) ×3 IMPLANT
PACK TOTAL KNEE CUSTOM (KITS) ×3 IMPLANT
PADDING CAST COTTON 6X4 STRL (CAST SUPPLIES) ×6 IMPLANT
PADDING CAST SYN 6 (CAST SUPPLIES) ×1
PADDING CAST SYNTHETIC 6X4 NS (CAST SUPPLIES) IMPLANT
PATELLA MEDIAL ATTUN 35MM KNEE (Knees) ×1 IMPLANT
PROTECTOR NERVE ULNAR (MISCELLANEOUS) ×3 IMPLANT
SET HNDPC FAN SPRY TIP SCT (DISPOSABLE) ×2 IMPLANT
SPIKE FLUID TRANSFER (MISCELLANEOUS) ×3 IMPLANT
STRIP CLOSURE SKIN 1/2X4 (GAUZE/BANDAGES/DRESSINGS) ×6 IMPLANT
SUT MNCRL AB 4-0 PS2 18 (SUTURE) ×3 IMPLANT
SUT STRATAFIX 0 PDS 27 VIOLET (SUTURE) ×2
SUT VIC AB 2-0 CT1 27 (SUTURE) ×6
SUT VIC AB 2-0 CT1 TAPERPNT 27 (SUTURE) ×6 IMPLANT
SUTURE STRATFX 0 PDS 27 VIOLET (SUTURE) ×2 IMPLANT
TRAY FOLEY MTR SLVR 16FR STAT (SET/KITS/TRAYS/PACK) ×3 IMPLANT
TUBE SUCTION HIGH CAP CLEAR NV (SUCTIONS) ×3 IMPLANT
WATER STERILE IRR 1000ML POUR (IV SOLUTION) ×6 IMPLANT
WRAP KNEE MAXI GEL POST OP (GAUZE/BANDAGES/DRESSINGS) ×3 IMPLANT

## 2021-12-21 NOTE — Discharge Instructions (Signed)
 Frank Aluisio, MD Total Joint Specialist [facility] [address] [place], [place] [address] [phone]  TOTAL KNEE REPLACEMENT POSTOPERATIVE DIRECTIONS    Knee Rehabilitation, Guidelines Following Surgery  Results after knee surgery are often greatly improved when you follow the exercise, range of motion and muscle strengthening exercises prescribed by your doctor. Safety measures are also important to protect the knee from further injury. If any of these exercises cause you to have increased pain or swelling in your knee joint, decrease the amount until you are comfortable again and slowly increase them. If you have problems or questions, call your caregiver or physical therapist for advice.   BLOOD CLOT PREVENTION Take a 325 mg Aspirin two times a day for three weeks following surgery. Then take an 81 mg Aspirin once a day for three weeks. Then discontinue Aspirin. You may resume your vitamins/supplements upon discharge from the hospital. Do not take any NSAIDs (Advil, Aleve, Ibuprofen, Meloxicam, etc.) until you have discontinued the 325 mg Aspirin.  HOME CARE INSTRUCTIONS  Remove items at home which could result in a fall. This includes throw rugs or furniture in walking pathways.  ICE to the affected knee as much as tolerated. Icing helps control swelling. If the swelling is well controlled you will be more comfortable and rehab easier. Continue to use ice on the knee for pain and swelling from surgery. You may notice swelling that will progress down to the foot and ankle. This is normal after surgery. Elevate the leg when you are not up walking on it.    Continue to use the breathing machine which will help keep your temperature down. It is common for your temperature to cycle up and down following surgery, especially at night when you are not up moving around and exerting yourself. The breathing machine keeps your lungs expanded and your temperature  down. Do not place pillow under the operative knee, focus on keeping the knee straight while resting  DIET You may resume your previous home diet once you are discharged from the hospital.  DRESSING / WOUND CARE / SHOWERING Keep your bulky bandage on for 2 days. On the third post-operative day you may remove the Ace bandage and gauze. There is a waterproof adhesive bandage on your skin which will stay in place until your first follow-up appointment. Once you remove this you will not need to place another bandage You may begin showering 3 days following surgery, but do not submerge the incision under water.  ACTIVITY For the first 5 days, the key is rest and control of pain and swelling Do your home exercises twice a day starting on post-operative day 3. On the days you go to physical therapy, just do the home exercises once that day. You should rest, ice and elevate the leg for 50 minutes out of every hour. Get up and walk/stretch for 10 minutes per hour. After 5 days you can increase your activity slowly as tolerated. Walk with your walker as instructed. Use the walker until you are comfortable transitioning to a cane. Walk with the cane in the opposite hand of the operative leg. You may discontinue the cane once you are comfortable and walking steadily. Avoid periods of inactivity such as sitting longer than an hour when not asleep. This helps prevent blood clots.  You may discontinue the knee immobilizer once you are able to perform a straight leg raise while lying down. You may resume a sexual relationship in one month   or when given the OK by your doctor.  You may return to work once you are cleared by your doctor.  Do not drive a car for 6 weeks or until released by your surgeon.  Do not drive while taking narcotics.  TED HOSE STOCKINGS Wear the elastic stockings on both legs for three weeks following surgery during the day. You may remove them at night for sleeping.  WEIGHT  BEARING Weight bearing as tolerated with assist device (walker, cane, etc) as directed, use it as long as suggested by your surgeon or therapist, typically at least 4-6 weeks.  POSTOPERATIVE CONSTIPATION PROTOCOL Constipation - defined medically as fewer than three stools per week and severe constipation as less than one stool per week.  One of the most common issues patients have following surgery is constipation.  Even if you have a regular bowel pattern at home, your normal regimen is likely to be disrupted due to multiple reasons following surgery.  Combination of anesthesia, postoperative narcotics, change in appetite and fluid intake all can affect your bowels.  In order to avoid complications following surgery, here are some recommendations in order to help you during your recovery period.  Colace (docusate) - Pick up an over-the-counter form of Colace or another stool softener and take twice a day as long as you are requiring postoperative pain medications.  Take with a full glass of water daily.  If you experience loose stools or diarrhea, hold the colace until you stool forms back up. If your symptoms do not get better within 1 week or if they get worse, check with your doctor. Dulcolax (bisacodyl) - Pick up over-the-counter and take as directed by the product packaging as needed to assist with the movement of your bowels.  Take with a full glass of water.  Use this product as needed if not relieved by Colace only.  MiraLax (polyethylene glycol) - Pick up over-the-counter to have on hand. MiraLax is a solution that will increase the amount of water in your bowels to assist with bowel movements.  Take as directed and can mix with a glass of water, juice, soda, coffee, or tea. Take if you go more than two days without a movement. Do not use MiraLax more than once per day. Call your doctor if you are still constipated or irregular after using this medication for 7 days in a row.  If you continue  to have problems with postoperative constipation, please contact the office for further assistance and recommendations.  If you experience "the worst abdominal pain ever" or develop nausea or vomiting, please contact the office immediatly for further recommendations for treatment.  ITCHING If you experience itching with your medications, try taking only a single pain pill, or even half a pain pill at a time.  You can also use Benadryl over the counter for itching or also to help with sleep.   MEDICATIONS See your medication summary on the "After Visit Summary" that the nursing staff will review with you prior to discharge.  You may have some home medications which will be placed on hold until you complete the course of blood thinner medication.  It is important for you to complete the blood thinner medication as prescribed by your surgeon.  Continue your approved medications as instructed at time of discharge.  PRECAUTIONS If you experience chest pain or shortness of breath - call 911 immediately for transfer to the hospital emergency department.  If you develop a fever greater that 101 F,   purulent drainage from wound, increased redness or drainage from wound, foul odor from the wound/dressing, or calf pain - CONTACT YOUR SURGEON.                                                   FOLLOW-UP APPOINTMENTS Make sure you keep all of your appointments after your operation with your surgeon and caregivers. You should call the office at the above phone number and make an appointment for approximately two weeks after the date of your surgery or on the date instructed by your surgeon outlined in the "After Visit Summary".  RANGE OF MOTION AND STRENGTHENING EXERCISES  Rehabilitation of the knee is important following a knee injury or an operation. After just a few days of immobilization, the muscles of the thigh which control the knee become weakened and shrink (atrophy). Knee exercises are designed to build up  the tone and strength of the thigh muscles and to improve knee motion. Often times heat used for twenty to thirty minutes before working out will loosen up your tissues and help with improving the range of motion but do not use heat for the first two weeks following surgery. These exercises can be done on a training (exercise) mat, on the floor, on a table or on a bed. Use what ever works the best and is most comfortable for you Knee exercises include:  Leg Lifts - While your knee is still immobilized in a splint or cast, you can do straight leg raises. Lift the leg to 60 degrees, hold for 3 sec, and slowly lower the leg. Repeat 10-20 times 2-3 times daily. Perform this exercise against resistance later as your knee gets better.  Quad and Hamstring Sets - Tighten up the muscle on the front of the thigh (Quad) and hold for 5-10 sec. Repeat this 10-20 times hourly. Hamstring sets are done by pushing the foot backward against an object and holding for 5-10 sec. Repeat as with quad sets.  Leg Slides: Lying on your back, slowly slide your foot toward your buttocks, bending your knee up off the floor (only go as far as is comfortable). Then slowly slide your foot back down until your leg is flat on the floor again. Angel Wings: Lying on your back spread your legs to the side as far apart as you can without causing discomfort.  A rehabilitation program following serious knee injuries can speed recovery and prevent re-injury in the future due to weakened muscles. Contact your doctor or a physical therapist for more information on knee rehabilitation.   POST-OPERATIVE OPIOID TAPER INSTRUCTIONS: It is important to wean off of your opioid medication as soon as possible. If you do not need pain medication after your surgery it is ok to stop day one. Opioids include: Codeine, Hydrocodone(Norco, Vicodin), Oxycodone(Percocet, oxycontin) and hydromorphone amongst others.  Long term and even short term use of opiods can  cause: Increased pain response Dependence Constipation Depression Respiratory depression And more.  Withdrawal symptoms can include Flu like symptoms Nausea, vomiting And more Techniques to manage these symptoms Hydrate well Eat regular healthy meals Stay active Use relaxation techniques(deep breathing, meditating, yoga) Do Not substitute Alcohol to help with tapering If you have been on opioids for less than two weeks and do not have pain than it is ok to stop all together.  Plan   to wean off of opioids This plan should start within one week post op of your joint replacement. Maintain the same interval or time between taking each dose and first decrease the dose.  Cut the total daily intake of opioids by one tablet each day Next start to increase the time between doses. The last dose that should be eliminated is the evening dose.   IF YOU ARE TRANSFERRED TO A SKILLED REHAB FACILITY If the patient is transferred to a skilled rehab facility following release from the hospital, a list of the current medications will be sent to the facility for the patient to continue.  When discharged from the skilled rehab facility, please have the facility set up the patient's Home Health Physical Therapy prior to being released. Also, the skilled facility will be responsible for providing the patient with their medications at time of release from the facility to include their pain medication, the muscle relaxants, and their blood thinner medication. If the patient is still at the rehab facility at time of the two week follow up appointment, the skilled rehab facility will also need to assist the patient in arranging follow up appointment in our office and any transportation needs.  MAKE SURE YOU:  Understand these instructions.  Get help right away if you are not doing well or get worse.   DENTAL ANTIBIOTICS:  In most cases prophylactic antibiotics for Dental procdeures after total joint surgery are  not necessary.  Exceptions are as follows:  1. History of prior total joint infection  2. Severely immunocompromised (Organ Transplant, cancer chemotherapy, Rheumatoid biologic medications such as Humera)  3. Poorly controlled diabetes (A1C &gt; 8.0, blood glucose over 200)  If you have one of these conditions, contact your surgeon for an antibiotic prescription, prior to your dental procedure.    Pick up stool softner and laxative for home use following surgery while on pain medications. Do not submerge incision under water. Please use good hand washing techniques while changing dressing each day. May shower starting three days after surgery. Please use a clean towel to pat the incision dry following showers. Continue to use ice for pain and swelling after surgery. Do not use any lotions or creams on the incision until instructed by your surgeon.  

## 2021-12-21 NOTE — Transfer of Care (Signed)
Immediate Anesthesia Transfer of Care Note  Patient: Quinesha Selinger  Procedure(s) Performed: TOTAL KNEE ARTHROPLASTY (Left: Knee)  Patient Location: PACU  Anesthesia Type:Spinal  Level of Consciousness: awake, alert , oriented and patient cooperative  Airway & Oxygen Therapy: Patient Spontanous Breathing  Post-op Assessment: Report given to RN and Post -op Vital signs reviewed and stable  Post vital signs: Reviewed and stable  Last Vitals:  Vitals Value Taken Time  BP 124/81 12/21/21 1003  Temp    Pulse 65 12/21/21 1007  Resp 17 12/21/21 1007  SpO2 98 % 12/21/21 1007  Vitals shown include unvalidated device data.  Last Pain:  Vitals:   12/21/21 0806  TempSrc:   PainSc: 0-No pain         Complications: No notable events documented.

## 2021-12-21 NOTE — Op Note (Signed)
OPERATIVE REPORT-TOTAL KNEE ARTHROPLASTY   Pre-operative diagnosis- Osteoarthritis  Left knee(s)  Post-operative diagnosis- Osteoarthritis Left knee(s)  Procedure-  Left  Total Knee Arthroplasty  Surgeon- Dione Plover. Elvin Banker, MD  Assistant- Molli Barrows, PA-C   Anesthesia-   Adductor canal block and spinal  EBL-50 mL   Drains None  Tourniquet time-  Total Tourniquet Time Documented: Thigh (Left) - 39 minutes Total: Thigh (Left) - 39 minutes     Complications- None  Condition-PACU - hemodynamically stable.   Brief Clinical Note  Denise Foster is a 71 y.o. year old female with end stage OA of her left knee with progressively worsening pain and dysfunction. She has constant pain, with activity and at rest and significant functional deficits with difficulties even with ADLs. She has had extensive non-op management including analgesics, injections of cortisone and viscosupplements, and home exercise program, but remains in significant pain with significant dysfunction. Radiographs show bone on bone arthritis medial and patellofemoral. She presents now for left Total Knee Arthroplasty.     Procedure in detail---   The patient is brought into the operating room and positioned supine on the operating table. After successful administration of  Adductor canal block and spinal,   a tourniquet is placed high on the  Left thigh(s) and the lower extremity is prepped and draped in the usual sterile fashion. Time out is performed by the operating team and then the  Left lower extremity is wrapped in Esmarch, knee flexed and the tourniquet inflated to 300 mmHg.       A midline incision is made with a ten blade through the subcutaneous tissue to the level of the extensor mechanism. A fresh blade is used to make a medial parapatellar arthrotomy. Soft tissue over the proximal medial tibia is subperiosteally elevated to the joint line with a knife and into the semimembranosus bursa with a Cobb  elevator. Soft tissue over the proximal lateral tibia is elevated with attention being paid to avoiding the patellar tendon on the tibial tubercle. The patella is everted, knee flexed 90 degrees and the ACL and PCL are removed. Findings are bone on bone medial and patellofemoral with large global osteophytes.        The drill is used to create a starting hole in the distal femur and the canal is thoroughly irrigated with sterile saline to remove the fatty contents. The 5 degree Left  valgus alignment guide is placed into the femoral canal and the distal femoral cutting block is pinned to remove 9 mm off the distal femur. Resection is made with an oscillating saw.      The tibia is subluxed forward and the menisci are removed. The extramedullary alignment guide is placed referencing proximally at the medial aspect of the tibial tubercle and distally along the second metatarsal axis and tibial crest. The block is pinned to remove 80m off the more deficient medial  side. Resection is made with an oscillating saw. Size 4is the most appropriate size for the tibia and the proximal tibia is prepared with the modular drill and keel punch for that size.      The femoral sizing guide is placed and size 4 is most appropriate. Rotation is marked off the epicondylar axis and confirmed by creating a rectangular flexion gap at 90 degrees. The size 4 cutting block is pinned in this rotation and the anterior, posterior and chamfer cuts are made with the oscillating saw. The intercondylar block is then placed and that cut is  made.      Trial size 4 tibial component, trial size 4 posterior stabilized femur and a 7  mm posterior stabilized rotating platform insert trial is placed. Full extension is achieved with excellent varus/valgus and anterior/posterior balance throughout full range of motion. The patella is everted and thickness measured to be 22  mm. Free hand resection is taken to 12 mm, a 35 template is placed, lug holes  are drilled, trial patella is placed, and it tracks normally. Osteophytes are removed off the posterior femur with the trial in place. All trials are removed and the cut bone surfaces prepared with pulsatile lavage. Cement is mixed and once ready for implantation, the size 4 tibial implant, size  4 posterior stabilized femoral component, and the size 35 patella are cemented in place and the patella is held with the clamp. The trial insert is placed and the knee held in full extension. The Exparel (20 ml mixed with 60 ml saline) is injected into the extensor mechanism, posterior capsule, medial and lateral gutters and subcutaneous tissues.  All extruded cement is removed and once the cement is hard the permanent 7 mm posterior stabilized rotating platform insert is placed into the tibial tray.      The wound is copiously irrigated with saline solution and the extensor mechanism closed with # 0 Stratofix suture. The tourniquet is released for a total tourniquet time of 39  minutes. Flexion against gravity is 140 degrees and the patella tracks normally. Subcutaneous tissue is closed with 2.0 vicryl and subcuticular with running 4.0 Monocryl. The incision is cleaned and dried and steri-strips and a bulky sterile dressing are applied. The limb is placed into a knee immobilizer and the patient is awakened and transported to recovery in stable condition.      Please note that a surgical assistant was a medical necessity for this procedure in order to perform it in a safe and expeditious manner. Surgical assistant was necessary to retract the ligaments and vital neurovascular structures to prevent injury to them and also necessary for proper positioning of the limb to allow for anatomic placement of the prosthesis.   Dione Plover Teri Legacy, MD    12/21/2021, 9:33 AM

## 2021-12-21 NOTE — Interval H&P Note (Signed)
History and Physical Interval Note:  12/21/2021 6:31 AM  Denise Foster  has presented today for surgery, with the diagnosis of Left knee osteoarthritis.  The various methods of treatment have been discussed with the patient and family. After consideration of risks, benefits and other options for treatment, the patient has consented to  Procedure(s): TOTAL KNEE ARTHROPLASTY (Left) as a surgical intervention.  The patient's history has been reviewed, patient examined, no change in status, stable for surgery.  I have reviewed the patient's chart and labs.  Questions were answered to the patient's satisfaction.     Pilar Plate [name]

## 2021-12-21 NOTE — Anesthesia Postprocedure Evaluation (Signed)
Anesthesia Post Note  Patient: Denise Foster  Procedure(s) Performed: TOTAL KNEE ARTHROPLASTY (Left: Knee)     Patient location during evaluation: PACU Anesthesia Type: Spinal Level of consciousness: oriented and awake and alert Pain management: pain level controlled Vital Signs Assessment: post-procedure vital signs reviewed and stable Respiratory status: spontaneous breathing, respiratory function stable and nonlabored ventilation Cardiovascular status: blood pressure returned to baseline and stable Postop Assessment: no headache, no backache, no apparent nausea or vomiting and spinal receding Anesthetic complications: no   No notable events documented.  Last Vitals:  Vitals:   12/21/21 1049 12/21/21 1315  BP: (!) 146/88 127/86  Pulse: 67 75  Resp: 17 17  Temp: 36.4 C 36.5 C  SpO2: 97% 99%    Last Pain:  Vitals:   12/21/21 1540  TempSrc:   PainSc: 2                  Lidia Collum

## 2021-12-21 NOTE — Anesthesia Procedure Notes (Signed)
Spinal  Patient location during procedure: OR Reason for block: surgical anesthesia Staffing Performed: anesthesiologist  Anesthesiologist: Lidia Collum, MD Performed by: Lidia Collum, MD Authorized by: Lidia Collum, MD   Preanesthetic Checklist Completed: patient identified, IV checked, risks and benefits discussed, surgical consent, monitors and equipment checked, pre-op evaluation and timeout performed Spinal Block Patient position: sitting Prep: DuraPrep and site prepped and draped Patient monitoring: continuous pulse ox, blood pressure and heart rate Approach: left paramedian Location: L3-4 Injection technique: single-shot Needle Needle type: Whitacre  Needle gauge: 22 G Needle length: 10 cm Assessment Events: CSF return Additional Notes Functioning IV was confirmed and monitors were applied. Sterile prep and drape, including hand hygiene and sterile gloves were used. The patient was positioned and the spine was prepped. The skin was anesthetized with lidocaine.  Multiple attempts midline, right and left paramedian. Ultimately successful left paramedian with 22ga Whitacre. Free flow of clear CSF was obtained prior to injecting local anesthetic into the CSF. The needle was carefully withdrawn. The patient tolerated the procedure well.

## 2021-12-21 NOTE — Anesthesia Procedure Notes (Signed)
Anesthesia Regional Block: Adductor canal block   Pre-Anesthetic Checklist: , timeout performed,  Correct Patient, Correct Site, Correct Laterality,  Correct Procedure, Correct Position, site marked,  Risks and benefits discussed,  Surgical consent,  Pre-op evaluation,  At surgeon's request and post-op pain management  Laterality: Left  Prep: chloraprep       Needles:  Injection technique: Single-shot  Needle Type: Echogenic Stimulator Needle     Needle Length: 10cm  Needle Gauge: 20     Additional Needles:   Procedures:,,,, ultrasound used (permanent image in chart),,    Narrative:  Start time: 12/21/2021 8:04 AM End time: 12/21/2021 8:08 AM Injection made incrementally with aspirations every 5 mL.  Performed by: Personally  Anesthesiologist: Lidia Collum, MD  Additional Notes: Standard monitors applied. Skin prepped. Good needle visualization with ultrasound. Injection made in 5cc increments with no resistance to injection. Patient tolerated the procedure well.

## 2021-12-21 NOTE — Care Plan (Signed)
Ortho Bundle Case Management Note  Patient Details  Name: Emry Tobin MRN: [id number] Date of Birth: 1951-03-06  L TKA on 12-21-21 DCP:  Home with friend DME:  No needs, has a RW PT:  [facility] on 12-24-21.                   DME Arranged:  N/A DME Agency:  NA  HH Arranged:  NA HH Agency:  NA  Additional Comments: Please contact me with any questions of if this plan should need to change.  Marianne Sofia, RN,CCM [facility]  [phone] 12/21/2021, 3:44 PM

## 2021-12-21 NOTE — Evaluation (Signed)
Physical Therapy Evaluation Patient Details Name: Laurita Peron MRN: [id number] DOB: 1950-06-30 Today's Date: 12/21/2021  History of Present Illness  Patient is 71 y.o. female s/p Lt TKA on 12/21/21 with PMH significant for ADHD, anxiety, depression, fibromyalgia, OA, PTSD,  spinal fusion.    Clinical Impression  Tanise Russman is a 71 y.o. female POD 0 s/p Lt TKA. Patient reports independence with mobility at baseline. Patient is now limited by functional impairments (see PT problem list below) and requires min assist for transfers and gait with RW. Patient was able to ambulate ~70 feet with RW and min assist. Patient instructed in exercise to facilitate ROM and circulation to manage edema. Patient will benefit from continued skilled PT interventions to address impairments and progress towards PLOF. Acute PT will follow to progress mobility and stair training in preparation for safe discharge home.      Recommendations for follow up therapy are one component of a multi-disciplinary discharge planning process, led by the attending physician.  Recommendations may be updated based on patient status, additional functional criteria and insurance authorization.  Follow Up Recommendations Follow physician's recommendations for discharge plan and follow up therapies      Assistance Recommended at Discharge Intermittent Supervision/Assistance  Patient can return home with the following  A little help with walking and/or transfers;A little help with bathing/dressing/bathroom;Assistance with cooking/housework;Direct supervision/assist for medications management;Assist for transportation;Help with stairs or ramp for entrance    Equipment Recommendations Rolling walker (2 wheels)  Recommendations for Other Services       Functional Status Assessment Patient has had a recent decline in their functional status and demonstrates the ability to make significant improvements in function in a  reasonable and predictable amount of time.     Precautions / Restrictions Precautions Precautions: Fall Restrictions Weight Bearing Restrictions: No LLE Weight Bearing: Weight bearing as tolerated      Mobility  Bed Mobility Overal bed mobility: Needs Assistance Bed Mobility: Supine to Sit     Supine to sit: Supervision, HOB elevated     General bed mobility comments: no assist needed , pt using bed rail to pivot/raise trunk    Transfers Overall transfer level: Needs assistance Equipment used: Rolling walker (2 wheels) Transfers: Sit to/from Stand Sit to Stand: Min guard           General transfer comment: gaurding for safety and cues for hand placement on RW.    Ambulation/Gait Ambulation/Gait assistance: Min guard Gait Distance (Feet): 70 Feet Assistive device: Rolling walker (2 wheels) Gait Pattern/deviations: Step-to pattern, Step-through pattern, Decreased stride length Gait velocity: decr     General Gait Details: cues for hand placement on RW, no overt LOB or buckling on Lt knee. cues to continue step to pattern for safety.  Stairs            Wheelchair Mobility    Modified Rankin (Stroke Patients Only)       Balance Overall balance assessment: Needs assistance Sitting-balance support: Feet supported Sitting balance-Leahy Scale: Good     Standing balance support: During functional activity, Bilateral upper extremity supported Standing balance-Leahy Scale: Fair                               Pertinent Vitals/Pain Pain Assessment Pain Assessment: 0-10 Pain Score: 5  Pain Location: Lt knee Pain Descriptors / Indicators: Tightness Pain Intervention(s): Limited activity within patient's tolerance, Monitored during session, Repositioned, Ice applied  Home Living Family/patient expects to be discharged to:: Private residence Living Arrangements: Non-relatives/Friends Available Help at Discharge: Family Type of Home:  House Home Access: Stairs to enter Entrance Stairs-Rails: Right Entrance Stairs-Number of Steps: 2   Home Layout: One [place]: Conservation officer, nature (2 wheels);Cane - single point;Tub bench Additional Comments: going to her friends house for shower (walk in shower)    Prior Function Prior Level of Function : Independent/Modified Independent                     Hand Dominance   Dominant Hand: Right    Extremity/Trunk Assessment   Upper Extremity Assessment Upper Extremity Assessment: Overall WFL for tasks assessed    Lower Extremity Assessment Lower Extremity Assessment: LLE deficits/detail LLE Sensation: WNL LLE Coordination: WNL    Cervical / Trunk Assessment Cervical / Trunk Assessment: Normal  Communication   Communication: No difficulties  Cognition Arousal/Alertness: Awake/alert Behavior During Therapy: WFL for tasks assessed/performed Overall Cognitive Status: Within Functional Limits for tasks assessed                                          General Comments      Exercises Total Joint Exercises Ankle Circles/Pumps: AROM, Both, 20 reps Quad Sets: AROM, Left, 10 reps Heel Slides: AAROM, Left, 10 reps   Assessment/Plan    PT Assessment Patient needs continued PT services  PT Problem List Decreased strength;Decreased range of motion;Decreased mobility;Decreased activity tolerance;Decreased balance;Decreased knowledge of use of DME;Pain       PT Treatment Interventions DME instruction;Gait training;Stair training;Functional mobility training;Therapeutic exercise;Balance training;Therapeutic activities;Patient/family education    PT Goals (Current goals can be found in the Care Plan section)  Acute Rehab PT Goals Patient Stated Goal: recover and goh ome with help of friends PT Goal Formulation: With patient Time For Goal Achievement: 12/28/21 Potential to Achieve Goals: Good    Frequency 7X/week     Co-evaluation                AM-PAC PT "6 Clicks" Mobility  Outcome Measure Help needed turning from your back to your side while in a flat bed without using bedrails?: A Little Help needed moving from lying on your back to sitting on the side of a flat bed without using bedrails?: A Little Help needed moving to and from a bed to a chair (including a wheelchair)?: A Little Help needed standing up from a chair using your arms (e.g., wheelchair or bedside chair)?: A Little Help needed to walk in hospital room?: A Little Help needed climbing 3-5 steps with a railing? : A Little 6 Click Score: 18    End of Session Equipment Utilized During Treatment: Gait belt Activity Tolerance: Patient tolerated treatment well Patient left: with call bell/phone within reach;in chair;with chair alarm set Nurse Communication: Mobility status PT Visit Diagnosis: Muscle weakness (generalized) (M62.81);Difficulty in walking, not elsewhere classified (R26.2)    Time: 4970-2637 PT Time Calculation (min) (ACUTE ONLY): 23 min   Charges:   PT Evaluation $PT Eval Low Complexity: 1 Low PT Treatments $Gait Training: 8-22 mins        Verner Mould, DPT Acute Rehabilitation Services Office [phone] Pager [phone]  12/21/21 3:34 PM   Jacques Navy 12/21/2021, 3:34 PM

## 2021-12-22 DIAGNOSIS — M1712 Unilateral primary osteoarthritis, left knee: Secondary | ICD-10-CM | POA: Diagnosis not present

## 2021-12-22 LAB — CBC
HCT: 39.5 % (ref 36.0–46.0)
Hemoglobin: 12.6 g/dL (ref 12.0–15.0)
MCH: 30.4 pg (ref 26.0–34.0)
MCHC: 31.9 g/dL (ref 30.0–36.0)
MCV: 95.2 fL (ref 80.0–100.0)
Platelets: 320 10*3/uL (ref 150–400)
RBC: 4.15 MIL/uL (ref 3.87–5.11)
RDW: 13.7 % (ref 11.5–15.5)
WBC: 14.5 10*3/uL — ABNORMAL HIGH (ref 4.0–10.5)
nRBC: 0 % (ref 0.0–0.2)

## 2021-12-22 LAB — BASIC METABOLIC PANEL
Anion gap: 8 (ref 5–15)
BUN: 11 mg/dL (ref 8–23)
CO2: 23 mmol/L (ref 22–32)
Calcium: 8.8 mg/dL — ABNORMAL LOW (ref 8.9–10.3)
Chloride: 110 mmol/L (ref 98–111)
Creatinine, Ser: 0.48 mg/dL (ref 0.44–1.00)
GFR, Estimated: >60 mL/min (ref >60)
Glucose, Bld: 125 mg/dL — ABNORMAL HIGH (ref 70–99)
Potassium: 3.7 mmol/L (ref 3.5–5.1)
Sodium: 141 mmol/L (ref 135–145)

## 2021-12-22 MED ORDER — ASPIRIN 325 MG PO TBEC: 325.0000 mg | DELAYED_RELEASE_TABLET | Freq: Two times a day (BID) | ORAL | 0 refills | Status: AC

## 2021-12-22 MED ORDER — OXYCODONE HCL 5 MG PO TABS
5.0000 mg | ORAL_TABLET | Freq: Four times a day (QID) | ORAL | 0 refills | Status: DC | PRN
Start: 2021-12-22 — End: 2022-04-09

## 2021-12-22 MED ORDER — METHOCARBAMOL 500 MG PO TABS: 500.0000 mg | ORAL_TABLET | Freq: Four times a day (QID) | ORAL | 0 refills | Status: AC | PRN

## 2021-12-22 NOTE — Progress Notes (Signed)
Patient discharged to home with friends. Given all belongings, instructions. Verbalized understanding of all instructions. Escorted to pov via w/c.

## 2021-12-22 NOTE — Progress Notes (Signed)
Physical Therapy Treatment Patient Details Name: Denise Foster MRN: [id number] DOB: June 18, 1950 Today's Date: 12/22/2021   History of Present Illness Patient is 71 y.o. female s/p Lt TKA on 12/21/21 with PMH significant for ADHD, anxiety, depression, fibromyalgia, OA, PTSD,  spinal fusion.    PT Comments    Patient is making excellent progress with mobility and demonstrated safe bed mobility and walker management for transfers and gait. Pt ambulated ~250' with RW and no LOB, supervision only with no cues for sequencing/safety. Pt completed stair negotiation with VC's from therapist for safe step pattern. Reviewed HEP EOS for ROM, strength, and circulation. Addressed questions from pt and friend in preparation for safe discharge home. Pt is mobilizing at safe level for discharge home with assist from friends. Will continue to progress during acute stay.     Recommendations for follow up therapy are one component of a multi-disciplinary discharge planning process, led by the attending physician.  Recommendations may be updated based on patient status, additional functional criteria and insurance authorization.  Follow Up Recommendations  Follow physician's recommendations for discharge plan and follow up therapies     Assistance Recommended at Discharge Intermittent Supervision/Assistance  Patient can return home with the following A little help with walking and/or transfers;A little help with bathing/dressing/bathroom;Assistance with cooking/housework;Direct supervision/assist for medications management;Assist for transportation;Help with stairs or ramp for entrance   Equipment Recommendations  Rolling walker (2 wheels)    Recommendations for Other Services       Precautions / Restrictions Precautions Precautions: Fall Restrictions Weight Bearing Restrictions: No LLE Weight Bearing: Weight bearing as tolerated     Mobility  Bed Mobility Overal bed mobility: Needs  Assistance Bed Mobility: Supine to Sit     Supine to sit: Supervision, HOB elevated     General bed mobility comments: supervision for safety    Transfers Overall transfer level: Needs assistance Equipment used: Rolling walker (2 wheels) Transfers: Sit to/from Stand Sit to Stand: Supervision           General transfer comment: supervision for transfers from EOB and recliner. pt demonstrates good carryover for safe technique to RW.    Ambulation/Gait Ambulation/Gait assistance: Supervision Gait Distance (Feet): 250 Feet Assistive device: Rolling walker (2 wheels) Gait Pattern/deviations: Step-to pattern, Step-through pattern, Decreased stride length Gait velocity: decr     General Gait Details: pt demonstrated safe management of RW and good step pattern with no buckling at Lt knee. supervision for safety, no LOB noted.   Stairs Stairs: Yes Stairs assistance: Min guard, Supervision Stair Management: One rail Right, Step to pattern, Forwards, With cane Number of Stairs: 3 General stair comments: VC's for sequencing "up with good, down with bad" and for safe sequece of SPC with Lt LE. No LOB noted.   Wheelchair Mobility    Modified Rankin (Stroke Patients Only)       Balance Overall balance assessment: Needs assistance Sitting-balance support: Feet supported Sitting balance-Leahy Scale: Good     Standing balance support: During functional activity, Bilateral upper extremity supported Standing balance-Leahy Scale: Fair                              Cognition Arousal/Alertness: Awake/alert Behavior During Therapy: WFL for tasks assessed/performed Overall Cognitive Status: Within Functional Limits for tasks assessed  Exercises Total Joint Exercises Ankle Circles/Pumps: AROM, Both, 20 reps Quad Sets: AROM, Left, 5 reps Short Arc Quad: AROM, Left, 5 reps Heel Slides: AAROM, Left, 5  reps Hip ABduction/ADduction: AROM, Left, 5 reps Straight Leg Raises: AROM, Left, 5 reps    General Comments        Pertinent Vitals/Pain Pain Assessment Pain Assessment: 0-10 Pain Score: 7  (just when WB) Pain Location: Lt knee Pain Descriptors / Indicators: Tightness Pain Intervention(s): Limited activity within patient's tolerance, Monitored during session, Repositioned    Home Living                          Prior Function            PT Goals (current goals can now be found in the care plan section) Acute Rehab PT Goals Patient Stated Goal: recover and go home with help of friends PT Goal Formulation: With patient Time For Goal Achievement: 12/28/21 Potential to Achieve Goals: Good Progress towards PT goals: Progressing toward goals    Frequency    7X/week      PT Plan Current plan remains appropriate    Co-evaluation              AM-PAC PT "6 Clicks" Mobility   Outcome Measure  Help needed turning from your back to your side while in a flat bed without using bedrails?: None Help needed moving from lying on your back to sitting on the side of a flat bed without using bedrails?: None Help needed moving to and from a bed to a chair (including a wheelchair)?: A Little Help needed standing up from a chair using your arms (e.g., wheelchair or bedside chair)?: A Little Help needed to walk in hospital room?: A Little Help needed climbing 3-5 steps with a railing? : A Little 6 Click Score: 20    End of Session Equipment Utilized During Treatment: Gait belt Activity Tolerance: Patient tolerated treatment well Patient left: with call bell/phone within reach;in chair;with chair alarm set Nurse Communication: Mobility status PT Visit Diagnosis: Muscle weakness (generalized) (M62.81);Difficulty in walking, not elsewhere classified (R26.2)     Time: 6945-0388 PT Time Calculation (min) (ACUTE ONLY): 26 min  Charges:  $Gait Training: 8-22  mins $Therapeutic Exercise: 8-22 mins                     Verner Mould, DPT Acute Rehabilitation Services Office [phone] Pager [phone]  12/22/21 9:52 AM

## 2021-12-22 NOTE — Progress Notes (Signed)
Subjective: 1 Day Post-Op Procedure(s) (LRB): TOTAL KNEE ARTHROPLASTY (Left) Patient reports pain as mild.   Patient seen in rounds by Dr. Wynelle Link. Patient is well, and has had no acute complaints or problems No issues overnight. Denies chest pain, SOB, or calf pain. Foley catheter removed this AM.  We will continue therapy today, ambulated 62' yesterday.   Objective: Vital signs in last 24 hours: Temp:  [97.5 F (36.4 C)-98.2 F (36.8 C)] 98.1 F (36.7 C) (07/25 0621) Pulse Rate:  [63-84] 76 (07/25 0621) Resp:  [15-19] 18 (07/25 0621) BP: (124-152)/(71-95) 129/95 (07/25 0621) SpO2:  [96 %-100 %] 98 % (07/25 0621) Weight:  [82.1 kg] 82.1 kg (07/24 1315)  Intake/Output from previous day:  Intake/Output Summary (Last 24 hours) at 12/22/2021 0757 Last data filed at 12/22/2021 0600 Gross per 24 hour  Intake 3900.24 ml  Output 3850 ml  Net 50.24 ml     Intake/Output this shift: No intake/output data recorded.  Labs: Recent Labs    12/22/21 0318  HGB 12.6   Recent Labs    12/22/21 0318  WBC 14.5*  RBC 4.15  HCT 39.5  PLT 320   Recent Labs    12/22/21 0318  NA 141  K 3.7  CL 110  CO2 23  BUN 11  CREATININE 0.48  GLUCOSE 125*  CALCIUM 8.8*   No results for input(s): "LABPT", "INR" in the last 72 hours.  Exam: General - Patient is Alert and Oriented Extremity - Neurologically intact Neurovascular intact Sensation intact distally Dorsiflexion/Plantar flexion intact Dressing - dressing C/D/I Motor Function - intact, moving foot and toes well on exam.   Past Medical History:  Diagnosis Date   ADHD    Anxiety    Atypical mole 12/29/2018   atypical intradermal melanmocytic proliferation on left side chest - excision   Depression    Fibromyalgia    Hypercholesterolemia    Osteoarthritis    PONV (postoperative nausea and vomiting)    "could hear anything said" years ago   Pre-diabetes    PTSD (post-traumatic stress disorder)    SCCA (squamous cell  carcinoma) of skin 11/18/2021   Left Shoulder Anterior (in situ) (tx p bx)   Sigmoid diverticulitis 08/2019   Spinal stenosis    Squamous cell carcinoma of skin 01/28/2021   in situ-neck-anterior left ([id number])    Assessment/Plan: 1 Day Post-Op Procedure(s) (LRB): TOTAL KNEE ARTHROPLASTY (Left) Principal Problem:   OA (osteoarthritis) of knee Active Problems:   Osteoarthritis of left knee  Estimated body mass index is 39.17 kg/m as calculated from the following:   Height as of this encounter: '4\' 9"'$  (1.448 m).   Weight as of this encounter: 82.1 kg. Advance diet Up with therapy D/C IV fluids   Patient's anticipated LOS is less than 2 midnights, meeting these requirements: - Lives within 1 hour of care - Has a competent adult at home to recover with post-op recover - NO history of  - Chronic pain requiring opioids  - Diabetes  - Coronary Artery Disease  - Heart failure  - Heart attack  - Stroke  - DVT/VTE  - Cardiac arrhythmia  - Respiratory Failure/COPD  - Renal failure  - Anemia  - Advanced Liver disease  DVT Prophylaxis - Aspirin Weight bearing as tolerated. Continue therapy.  Plan is to go Home after hospital stay. Plan for discharge later today if progresses with therapy and meeting goals. [facility] Follow-up in the office in 2 weeks.  The PDMP database was reviewed today prior to any opioid medications being prescribed to this patient.  Theresa Duty, PA-C Orthopedic Surgery [phone] 12/22/2021, 7:57 AM

## 2021-12-23 ENCOUNTER — Encounter (HOSPITAL_COMMUNITY): Payer: Self-pay | Admitting: Orthopedic Surgery

## 2021-12-28 DIAGNOSIS — M25662 Stiffness of left knee, not elsewhere classified: Secondary | ICD-10-CM | POA: Insufficient documentation

## 2021-12-28 NOTE — Discharge Summary (Signed)
Patient ID: Denise Foster MRN: [id number] DOB/AGE: [birth year] 71 y.o.  Admit date: 12/21/2021 Discharge date: 12/22/2021  Admission Diagnoses:  Principal Problem:   OA (osteoarthritis) of knee Active Problems:   Osteoarthritis of left knee   Discharge Diagnoses:  Same  Past Medical History:  Diagnosis Date   ADHD    Anxiety    Atypical mole 12/29/2018   atypical intradermal melanmocytic proliferation on left side chest - excision   Depression    Fibromyalgia    Hypercholesterolemia    Osteoarthritis    PONV (postoperative nausea and vomiting)    "could hear anything said" years ago   Pre-diabetes    PTSD (post-traumatic stress disorder)    SCCA (squamous cell carcinoma) of skin 11/18/2021   Left Shoulder Anterior (in situ) (tx p bx)   Sigmoid diverticulitis 08/2019   Spinal stenosis    Squamous cell carcinoma of skin 01/28/2021   in situ-neck-anterior left (CX35FU)    Surgeries: Procedure(s): TOTAL KNEE ARTHROPLASTY on 12/21/2021   Consultants:   Discharged Condition: Improved  Hospital Course: Denise Foster is an 71 y.o. female who was admitted 12/21/2021 for operative treatment ofOA (osteoarthritis) of knee. Patient has severe unremitting pain that affects sleep, daily activities, and work/hobbies. After pre-op clearance the patient was taken to the operating room on 12/21/2021 and underwent  Procedure(s): TOTAL KNEE ARTHROPLASTY.    Patient was given perioperative antibiotics:  Anti-infectives (From admission, onward)    Start     Dose/Rate Route Frequency Ordered Stop   12/21/21 1400  ceFAZolin (ANCEF) IVPB 2g/100 mL premix        2 g 200 mL/hr over 30 Minutes Intravenous Every 6 hours 12/21/21 1046 12/21/21 2011   12/21/21 0600  ceFAZolin (ANCEF) IVPB 2g/100 mL premix        2 g 200 mL/hr over 30 Minutes Intravenous On call to O.R. 12/21/21 3151 12/21/21 0834        Patient was given sequential compression devices, early ambulation, and  chemoprophylaxis to prevent DVT.  Patient benefited maximally from hospital stay and there were no complications.    Recent vital signs: No data found.   Recent laboratory studies: No results for input(s): "WBC", "HGB", "HCT", "PLT", "NA", "K", "CL", "CO2", "BUN", "CREATININE", "GLUCOSE", "INR", "CALCIUM" in the last 72 hours.  Invalid input(s): "PT", "2"   Discharge Medications:   Allergies as of 12/22/2021       Reactions   Bee Venom Anaphylaxis   BEE STINGS   Sulfa Antibiotics Other (See Comments)   Blisters/ulcers.        Medication List     STOP taking these medications    cyclobenzaprine 10 MG tablet Commonly known as: FLEXERIL       TAKE these medications    acetaminophen 500 MG tablet Commonly known as: TYLENOL Take 1,000 mg by mouth every 6 (six) hours as needed for moderate pain.   Alpha-Lipoic Acid 600 MG Tabs Take 600 mg by mouth 2 (two) times daily.   ALPRAZolam 0.5 MG tablet Commonly known as: XANAX Take 0.25-0.5 mg by mouth See admin instructions. Take 0.5 mg at bedtime, may take 0.25 to 0.5 mg once during the day as needed for anxiety   aspirin EC 325 MG tablet Take 1 tablet (325 mg total) by mouth 2 (two) times daily for 20 days. Then take one 81 mg aspirin once a day for three weeks. Then discontinue aspirin.   cetirizine 10 MG tablet Commonly known as: ZYRTEC Take  10 mg by mouth at bedtime.   Clinpro 5000 1.1 % Pste Generic drug: Sodium Fluoride Place 1 application onto teeth at bedtime.   DULoxetine 60 MG capsule Commonly known as: CYMBALTA Take 60 mg by mouth 2 (two) times daily.   EpiPen 2-Pak 0.3 mg/0.3 mL Soaj injection Generic drug: EPINEPHrine Inject 0.3 mg into the muscle as needed for anaphylaxis ((bee stings)).   ezetimibe 10 MG tablet Commonly known as: ZETIA Take 10 mg by mouth at bedtime.   Fish Oil 1000 MG Caps Take 1,000 mg by mouth at bedtime.   gabapentin 600 MG tablet Commonly known as: NEURONTIN Take 600  mg by mouth at bedtime.   Magnesium 100 MG Tabs Take 100 mg by mouth daily.   methocarbamol 500 MG tablet Commonly known as: ROBAXIN Take 1 tablet (500 mg total) by mouth every 6 (six) hours as needed for muscle spasms.   methylphenidate 20 MG tablet Commonly known as: RITALIN Take 20 mg by mouth in the morning and at bedtime.   oxyCODONE 5 MG immediate release tablet Commonly known as: Oxy IR/ROXICODONE Take 1-2 tablets (5-10 mg total) by mouth every 6 (six) hours as needed for moderate pain or severe pain.   Restasis 0.05 % ophthalmic emulsion Generic drug: cycloSPORINE Place 1 drop into both eyes 2 (two) times daily.   rosuvastatin 10 MG tablet Commonly known as: CRESTOR Take 10 mg by mouth at bedtime.   Systane Balance 0.6 % Soln Generic drug: Propylene Glycol Place 1 drop into both eyes in the morning, at noon, and at bedtime.   tretinoin 0.1 % cream Commonly known as: RETIN-A Apply topically at bedtime. What changed: how much to take   Vitamin D-3 125 MCG (5000 UT) Tabs Take 5,000 Units by mouth daily.   zinc gluconate 50 MG tablet Take 50 mg by mouth daily.               Discharge Care Instructions  (From admission, onward)           Start     Ordered   12/22/21 0000  Weight bearing as tolerated        12/22/21 0800   12/22/21 0000  Change dressing       Comments: You may remove the bulky bandage (ACE wrap and gauze) two days after surgery. You will have an adhesive waterproof bandage underneath. Leave this in place until your first follow-up appointment.   12/22/21 0800            Diagnostic Studies: No results found.  Disposition: Discharge disposition: 01-Home or Self Care       Discharge Instructions     Call MD / Call 911   Complete by: As directed    If you experience chest pain or shortness of breath, CALL 911 and be transported to the hospital emergency room.  If you develope a fever above 101 F, pus (white drainage) or  increased drainage or redness at the wound, or calf pain, call your surgeon's office.   Change dressing   Complete by: As directed    You may remove the bulky bandage (ACE wrap and gauze) two days after surgery. You will have an adhesive waterproof bandage underneath. Leave this in place until your first follow-up appointment.   Constipation Prevention   Complete by: As directed    Drink plenty of fluids.  Prune juice may be helpful.  You may use a stool softener, such as Colace (over the counter)  100 mg twice a day.  Use MiraLax (over the counter) for constipation as needed.   Diet - low sodium heart healthy   Complete by: As directed    Do not put a pillow under the knee. Place it under the heel.   Complete by: As directed    Driving restrictions   Complete by: As directed    No driving for two weeks   Post-operative opioid taper instructions:   Complete by: As directed    POST-OPERATIVE OPIOID TAPER INSTRUCTIONS: It is important to wean off of your opioid medication as soon as possible. If you do not need pain medication after your surgery it is ok to stop day one. Opioids include: Codeine, Hydrocodone(Norco, Vicodin), Oxycodone(Percocet, oxycontin) and hydromorphone amongst others.  Long term and even short term use of opiods can cause: Increased pain response Dependence Constipation Depression Respiratory depression And more.  Withdrawal symptoms can include Flu like symptoms Nausea, vomiting And more Techniques to manage these symptoms Hydrate well Eat regular healthy meals Stay active Use relaxation techniques(deep breathing, meditating, yoga) Do Not substitute Alcohol to help with tapering If you have been on opioids for less than two weeks and do not have pain than it is ok to stop all together.  Plan to wean off of opioids This plan should start within one week post op of your joint replacement. Maintain the same interval or time between taking each dose and first  decrease the dose.  Cut the total daily intake of opioids by one tablet each day Next start to increase the time between doses. The last dose that should be eliminated is the evening dose.      TED hose   Complete by: As directed    Use stockings (TED hose) for three weeks on both leg(s).  You may remove them at night for sleeping.   Weight bearing as tolerated   Complete by: As directed         Follow-up Information     Nitzia Perren L, PA. Go on 01/07/2022.   Specialty: Orthopedic Surgery Why: You are scheduled for a follow up appointment on 01-07-22 at 11:00 am in the [place] office. Contact information: [address] [place] [address] [phone]                  Signed: Theresa Duty 12/28/2021, 7:20 AM

## 2022-01-28 ENCOUNTER — Ambulatory Visit: Payer: Medicare HMO | Admitting: Physician Assistant

## 2022-02-05 ENCOUNTER — Other Ambulatory Visit: Payer: Self-pay | Admitting: Obstetrics and Gynecology

## 2022-02-05 DIAGNOSIS — Z1231 Encounter for screening mammogram for malignant neoplasm of breast: Secondary | ICD-10-CM

## 2022-02-15 ENCOUNTER — Ambulatory Visit
Admission: RE | Admit: 2022-02-15 | Discharge: 2022-02-15 | Disposition: A | Discharge status: Home / Self Care | Payer: Medicare HMO | Source: Ambulatory Visit | Attending: Obstetrics and Gynecology | Admitting: Obstetrics and Gynecology

## 2022-02-15 DIAGNOSIS — Z1231 Encounter for screening mammogram for malignant neoplasm of breast: Secondary | ICD-10-CM

## 2022-04-09 ENCOUNTER — Inpatient Hospital Stay: Payer: Medicare HMO

## 2022-04-09 ENCOUNTER — Inpatient Hospital Stay: Payer: Medicare HMO | Attending: Hematology | Admitting: Hematology

## 2022-04-09 VITALS — BP 128/80 | HR 73 | Temp 98.1°F | Resp 16 | Ht <= 58 in | Wt 182.7 lb

## 2022-04-09 DIAGNOSIS — Z803 Family history of malignant neoplasm of breast: Secondary | ICD-10-CM | POA: Diagnosis not present

## 2022-04-09 DIAGNOSIS — M797 Fibromyalgia: Secondary | ICD-10-CM | POA: Diagnosis not present

## 2022-04-09 DIAGNOSIS — E669 Obesity, unspecified: Secondary | ICD-10-CM | POA: Insufficient documentation

## 2022-04-09 DIAGNOSIS — Z882 Allergy status to sulfonamides status: Secondary | ICD-10-CM | POA: Diagnosis not present

## 2022-04-09 DIAGNOSIS — R519 Headache, unspecified: Secondary | ICD-10-CM | POA: Diagnosis not present

## 2022-04-09 DIAGNOSIS — Z85828 Personal history of other malignant neoplasm of skin: Secondary | ICD-10-CM | POA: Diagnosis not present

## 2022-04-09 DIAGNOSIS — M1990 Unspecified osteoarthritis, unspecified site: Secondary | ICD-10-CM | POA: Insufficient documentation

## 2022-04-09 DIAGNOSIS — G629 Polyneuropathy, unspecified: Secondary | ICD-10-CM | POA: Insufficient documentation

## 2022-04-09 DIAGNOSIS — M459 Ankylosing spondylitis of unspecified sites in spine: Secondary | ICD-10-CM | POA: Insufficient documentation

## 2022-04-09 DIAGNOSIS — R200 Anesthesia of skin: Secondary | ICD-10-CM | POA: Diagnosis not present

## 2022-04-09 DIAGNOSIS — D72820 Lymphocytosis (symptomatic): Secondary | ICD-10-CM | POA: Diagnosis present

## 2022-04-09 DIAGNOSIS — M549 Dorsalgia, unspecified: Secondary | ICD-10-CM | POA: Insufficient documentation

## 2022-04-09 DIAGNOSIS — Z79899 Other long term (current) drug therapy: Secondary | ICD-10-CM | POA: Diagnosis not present

## 2022-04-09 DIAGNOSIS — D72829 Elevated white blood cell count, unspecified: Secondary | ICD-10-CM | POA: Insufficient documentation

## 2022-04-09 DIAGNOSIS — Z91030 Bee allergy status: Secondary | ICD-10-CM | POA: Diagnosis not present

## 2022-04-09 LAB — CBC WITH DIFFERENTIAL/PLATELET
Abs Immature Granulocytes: 0.04 10*3/uL (ref 0.00–0.07)
Basophils Absolute: 0 10*3/uL (ref 0.0–0.1)
Basophils Relative: 0 %
Eosinophils Absolute: 0.2 10*3/uL (ref 0.0–0.5)
Eosinophils Relative: 2 %
HCT: 42.5 % (ref 36.0–46.0)
Hemoglobin: 13.8 g/dL (ref 12.0–15.0)
Immature Granulocytes: 0 %
Lymphocytes Relative: 44 %
Lymphs Abs: 5.3 10*3/uL — ABNORMAL HIGH (ref 0.7–4.0)
MCH: 30.3 pg (ref 26.0–34.0)
MCHC: 32.5 g/dL (ref 30.0–36.0)
MCV: 93.4 fL (ref 80.0–100.0)
Monocytes Absolute: 0.9 10*3/uL (ref 0.1–1.0)
Monocytes Relative: 7 %
Neutro Abs: 5.6 10*3/uL (ref 1.7–7.7)
Neutrophils Relative %: 47 %
Platelets: 394 10*3/uL (ref 150–400)
RBC: 4.55 MIL/uL (ref 3.87–5.11)
RDW: 13.3 % (ref 11.5–15.5)
WBC: 12 10*3/uL — ABNORMAL HIGH (ref 4.0–10.5)
nRBC: 0 % (ref 0.0–0.2)

## 2022-04-09 LAB — LACTATE DEHYDROGENASE: LDH: 160 U/L (ref 98–192)

## 2022-04-09 NOTE — Progress Notes (Signed)
CONSULT NOTE  Patient Care Team: Celene Squibb, MD as PCP - General (Internal Medicine) Warren Danes, PA-C as Physician Assistant (Dermatology) Gala Romney Cristopher Estimable, MD as Consulting Physician (Gastroenterology) Derek Jack, MD as Medical Oncologist (Hematology)  CHIEF COMPLAINTS/PURPOSE OF CONSULTATION:  Lymphocytic leukocytosis.  HISTORY OF PRESENTING ILLNESS:  Denise Foster 71 y.o. female is seen in consultation today at the request of Valentino Nose, FNP for elevated white count and lymphocyte count.  Patient reports that she is aware of elevated white count for the last 10 years.  Recent CBC on 03/17/2022 showed white count 11.4 with absolute lymphocytosis.  CBC on 03/03/2022 showed elevated absolute leukocyte count of 7000 with white count 13.3.  In February 2023 white count was 12.2 with ALC of 6.2.  She denies any fevers, night sweats or weight loss.  No recurrent infections.  She has degenerative disc disease and ankylosing spondylitis.  She has neuropathy in the left leg since spinal surgery.  Denies any prior history of requiring blood transfusions.    MEDICAL HISTORY:  Past Medical History:  Diagnosis Date   ADHD    Anxiety    Atypical mole 12/29/2018   atypical intradermal melanmocytic proliferation on left side chest - excision   Depression    Fibromyalgia    Hypercholesterolemia    Osteoarthritis    PONV (postoperative nausea and vomiting)    "could hear anything said" years ago   Pre-diabetes    PTSD (post-traumatic stress disorder)    SCCA (squamous cell carcinoma) of skin 11/18/2021   Left Shoulder Anterior (in situ) (tx p bx)   Sigmoid diverticulitis 08/2019   Spinal stenosis    Squamous cell carcinoma of skin 01/28/2021   in situ-neck-anterior left (CX35FU)    SURGICAL HISTORY: Past Surgical History:  Procedure Laterality Date   BACK SURGERY     BREAST EXCISIONAL BIOPSY Left 1995   benign   CARPAL TUNNEL RELEASE     COLONOSCOPY  2005    Per patient, TCS in [place] with diverticulosis and small polyps   COLONOSCOPY WITH PROPOFOL N/A 02/18/2020   Procedure: COLONOSCOPY WITH PROPOFOL;  Surgeon: Daneil Dolin, MD;  Location: [facility];  Service: Endoscopy;  Laterality: N/A;  7:30am   PAROTIDECTOMY     POLYPECTOMY  02/18/2020   Procedure: POLYPECTOMY;  Surgeon: Daneil Dolin, MD;  Location: [facility];  Service: Endoscopy;;   SPINAL FUSION W/ LUQUE UNIT ROD  09/01/2020   TONSILLECTOMY     TOTAL KNEE ARTHROPLASTY Left 12/21/2021   Procedure: TOTAL KNEE ARTHROPLASTY;  Surgeon: Gaynelle Arabian, MD;  Location: [facility];  Service: Orthopedics;  Laterality: Left;    SOCIAL HISTORY: Social History   Socioeconomic History   Marital status: Divorced    Spouse name: Not on file   Number of children: Not on file   Years of education: Not on file   Highest education level: Not on file  Occupational History   Not on file  Tobacco Use   Smoking status: Never   Smokeless tobacco: Never  Vaping Use   Vaping Use: Never used  Substance and Sexual Activity   Alcohol use: Yes    Comment: rarely    Drug use: Never   Sexual activity: Not on file    Comment: CBD gummies  Other Topics Concern   Not on file  Social History Narrative   Not on file   Social Determinants of Health   Financial Resource Strain: Not on file  Food Insecurity: Not on file  Transportation Needs: Not on file  Physical Activity: Not on file  Stress: Not on file  Social Connections: Not on file  Intimate Partner Violence: Not on file    FAMILY HISTORY: Family History  Problem Relation Age of Onset   Breast cancer Neg Hx    Colon cancer Neg Hx     ALLERGIES:  is allergic to bee venom and sulfa antibiotics.  MEDICATIONS:  Current Outpatient Medications  Medication Sig Dispense Refill   acetaminophen (TYLENOL) 500 MG tablet Take 1,000 mg by mouth every 6 (six) hours as needed for moderate pain.     Alpha-Lipoic Acid 600 MG TABS Take  600 mg by mouth 2 (two) times daily.     ALPRAZolam (XANAX) 0.5 MG tablet Take 0.25-0.5 mg by mouth See admin instructions. Take 0.5 mg at bedtime, may take 0.25 to 0.5 mg once during the day as needed for anxiety     cetirizine (ZYRTEC) 10 MG tablet Take 10 mg by mouth at bedtime.     Cholecalciferol (VITAMIN D-3) 125 MCG (5000 UT) TABS Take 5,000 Units by mouth daily.     cyclobenzaprine (FLEXERIL) 10 MG tablet      DULoxetine (CYMBALTA) 60 MG capsule Take 60 mg by mouth 2 (two) times daily.     EPIPEN 2-PAK 0.3 MG/0.3ML SOAJ injection Inject 0.3 mg into the muscle as needed for anaphylaxis ((bee stings)).      ezetimibe (ZETIA) 10 MG tablet Take 10 mg by mouth at bedtime.      gabapentin (NEURONTIN) 600 MG tablet Take 600 mg by mouth at bedtime.     Magnesium 100 MG TABS Take 100 mg by mouth daily.     methocarbamol (ROBAXIN) 500 MG tablet Take 1 tablet (500 mg total) by mouth every 6 (six) hours as needed for muscle spasms. 40 tablet 0   methylphenidate (RITALIN) 20 MG tablet Take 20 mg by mouth in the morning and at bedtime.      Omega-3 Fatty Acids (FISH OIL) 1000 MG CAPS Take 1,000 mg by mouth at bedtime.     Propylene Glycol (SYSTANE BALANCE) 0.6 % SOLN Place 1 drop into both eyes in the morning, at noon, and at bedtime.     RESTASIS 0.05 % ophthalmic emulsion Place 1 drop into both eyes 2 (two) times daily.      rosuvastatin (CRESTOR) 10 MG tablet Take 10 mg by mouth at bedtime.     Sodium Fluoride (CLINPRO 5000) 1.1 % PSTE Place 1 application onto teeth at bedtime.     tretinoin (RETIN-A) 0.1 % cream Apply topically at bedtime. (Patient taking differently: Apply 1 Application topically at bedtime.) 45 g 2   zinc gluconate 50 MG tablet Take 50 mg by mouth daily.     No current facility-administered medications for this visit.    REVIEW OF SYSTEMS:   Constitutional: Denies fevers, chills or abnormal night sweats Eyes: Denies blurriness of vision, double vision or watery eyes Ears,  nose, mouth, throat, and face: Denies mucositis or sore throat Respiratory: Denies cough, dyspnea or wheezes Cardiovascular: Denies palpitation, chest discomfort or lower extremity swelling Gastrointestinal:  Denies nausea, heartburn or change in bowel habits Skin: Denies abnormal skin rashes Lymphatics: Denies new lymphadenopathy or easy bruising Neurological: Positive for tingling in the feet and headaches. Behavioral/Psych: Mood is stable, no new changes  All other systems were reviewed with the patient and are negative.  PHYSICAL EXAMINATION: ECOG PERFORMANCE STATUS: 1 -  Symptomatic but completely ambulatory  Vitals:   04/09/22 0801  BP: 128/80  Pulse: 73  Resp: 16  Temp: 98.1 F (36.7 C)  SpO2: 99%   Filed Weights   04/09/22 0801  Weight: 182 lb 11.2 oz (82.9 kg)    GENERAL:alert, no distress and comfortable SKIN: skin color, texture, turgor are normal, no rashes or significant lesions EYES: normal, conjunctiva are pink and non-injected, sclera clear OROPHARYNX:no exudate, no erythema and lips, buccal mucosa, and tongue normal  NECK: supple, thyroid normal size, non-tender, without nodularity LYMPH:  no palpable lymphadenopathy in the cervical, axillary or inguinal LUNGS: clear to auscultation and percussion with normal breathing effort HEART: regular rate & rhythm and no murmurs and no lower extremity edema ABDOMEN:abdomen soft, non-tender and normal bowel sounds Musculoskeletal:no cyanosis of digits and no clubbing  PSYCH: alert & oriented x 3 with fluent speech NEURO: no focal motor/sensory deficits  LABORATORY DATA:  I have reviewed the data as listed No results found for this or any previous visit (from the past 2160 hour(s)).  RADIOGRAPHIC STUDIES: I have personally reviewed the radiological images as listed and agreed with the findings in the report. No results found.  ASSESSMENT:  1.  Lymphocytic leukocytosis: - Patient seen at the request of Valentino Nose, FNP - CBC (03/17/2022): WBC-11.4, Hb-13.6, PLT-378 (47% N, 46% L, 6% M, 1% E) - CBC (03/03/2022): WBC-13.3, Hb-14, PLT-368, ALC-7.0 (2.7-3.1) - CBC (07/10/2021): WBC-12.2, Hb-14.3, PLT-362 (41% N, 58% L, 7% M) ALC-6.2 - Review of epic shows elevated white count since April 2021 with no differential. - She does not have any B symptoms or recurrent infections.  2.  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 - She was never smoker. - No family history of leukemias. - Paternal great aunt had breast cancer.   PLAN:  1.  Lymphocytic leukocytosis: - We have discussed her lab abnormalities and differential diagnosis in detail.  Differential diagnosis includes stage 0 CLL versus other indolent lymphomatous process. - Recommend repeating CBC today with differential and LDH. - Recommend flow cytometry for leukemia/lymphoma panel. - RTC 2 weeks to discuss results.   All questions were answered. The patient knows to call the clinic with any problems, questions or concerns.      Derek Jack, MD 04/09/22 8:57 AM

## 2022-04-09 NOTE — Progress Notes (Signed)
Chaplain engaged in a follow-up visit with Denise Foster.  Denise Foster shared about how she has been doing, the recent loss of a family member, and about some joys she has experienced in life.  Chaplain offered reflective listening, community and presence.    04/09/22 1100  Clinical Encounter Type  Visited With Patient  Visit Type Follow-up;Spiritual support

## 2022-04-09 NOTE — Patient Instructions (Addendum)
[place]  Discharge Instructions  You were seen and examined today by Dr. Delton Coombes. Dr. Delton Coombes is a hematologist, meaning that he specializes in blood abnormalities. Dr. Delton Coombes discussed your past medical history, family history of cancers/blood conditions and the events that led to you being here today.  You were referred to Dr. Delton Coombes due to an elevated white blood cell count. White blood cells are our blood cell that is responsible for fighting infections. They can also be elevated for a number of different reasons including medications and smoking. Dr. Delton Coombes has recommended additional labs today in an attempt to identify the cause of the elevation.  Follow-up as scheduled.  Thank you for choosing [facility] to provide your oncology and hematology care.   To afford each patient quality time with our provider, please arrive at least 15 minutes before your scheduled appointment time. You may need to reschedule your appointment if you arrive late (10 or more minutes). Arriving late affects you and other patients whose appointments are after yours.  Also, if you miss three or more appointments without notifying the office, you may be dismissed from the clinic at the provider's discretion.    Again, thank you for choosing [facility].  Our hope is that these requests will decrease the amount of time that you wait before being seen by our physicians.   If you have a lab appointment with the [facility] please come in thru the Main Entrance and check in at the main information desk.           _____________________________________________________________  Should you have questions after your visit to [facility], please contact our office at [phone] and follow the prompts.  Our office hours are 8:00 a.m. to 4:30 p.m. Monday - Thursday and 8:00 a.m. to 2:30 p.m. Friday.  Please note that  voicemails left after 4:00 p.m. may not be returned until the following business day.  We are closed weekends and all major holidays.  You do have access to a nurse 24-7, just call the main number to the clinic [phone] and do not press any options, hold on the line and a nurse will answer the phone.    For prescription refill requests, have your pharmacy contact our office and allow 72 hours.    Masks are optional in the cancer centers. If you would like for your care team to wear a mask while they are taking care of you, please let them know. You may have one support person who is at least 72 years old accompany you for your appointments.

## 2022-04-13 LAB — SURGICAL PATHOLOGY

## 2022-04-13 LAB — FLOW CYTOMETRY

## 2022-04-25 NOTE — Progress Notes (Signed)
[facility] [address], [place] [address]   CLINIC:  Medical Oncology/Hematology  PCP:  Celene Squibb, MD [place] [address] [phone]   REASON FOR VISIT:  Follow-up for lymphocytosis  PRIOR THERAPY: None  CURRENT THERAPY: Under workup  INTERVAL HISTORY:  Ms. Dragos 71 y.o. female returns for routine follow-up of her lymphocytosis.  She was seen for initial consultation by Dr. Delton Coombes on 04/09/2022.  Patient is not on any active steroid medications, but reports that she has had intermittent steroid injections for her knees and back for "several years," most recently in December 2021.  She denies any history of autoimmune connective tissue disorder, but reports that she has never been tested for this.  She does have history of fibromyalgia and reports that she has had joint pain since her late 19s.  Joint pain was initially in her ankles, but has migrated throughout her body.  She denies any rashes, but reports occasional dry patches of skin.  She has significant joint pain and stiffness in the morning, and treats herself with ice and heat therapy daily upon waking.  She denies any B symptoms such as fevers, chills, night sweats, unintentional weight loss.  She has not had any recent infections.  No new masses or lymphadenopathy per her  report.  Patient reports appetite at 100% and energy level at 100% . She is maintaining stable weight at this time.   REVIEW OF SYSTEMS:  Review of Systems  Constitutional:  Negative for appetite change, chills, diaphoresis, fatigue, fever and unexpected weight change.  HENT:   Negative for lump/mass and nosebleeds.   Eyes:  Negative for eye problems.  Respiratory:  Negative for cough, hemoptysis and shortness of breath.   Cardiovascular:  Negative for chest pain, leg swelling and palpitations.  Gastrointestinal:  Negative for abdominal pain, blood in stool, constipation, diarrhea, nausea and  vomiting.  Genitourinary:  Negative for hematuria.   Musculoskeletal:  Positive for arthralgias and back pain.  Skin: Negative.   Neurological:  Positive for headaches and numbness. Negative for dizziness and light-headedness.  Hematological:  Does not bruise/bleed easily.      PAST MEDICAL/SURGICAL HISTORY:  Past Medical History:  Diagnosis Date   ADHD    Anxiety    Atypical mole 12/29/2018   atypical intradermal melanmocytic proliferation on left side chest - excision   Depression    Fibromyalgia    Hypercholesterolemia    Osteoarthritis    PONV (postoperative nausea and vomiting)    "could hear anything said" years ago   Pre-diabetes    PTSD (post-traumatic stress disorder)    SCCA (squamous cell carcinoma) of skin 11/18/2021   Left Shoulder Anterior (in situ) (tx p bx)   Sigmoid diverticulitis 08/2019   Spinal stenosis    Squamous cell carcinoma of skin 01/28/2021   in situ-neck-anterior left (CX35FU)   Past Surgical History:  Procedure Laterality Date   BACK SURGERY     BREAST EXCISIONAL BIOPSY Left 1995   benign   CARPAL TUNNEL RELEASE     COLONOSCOPY  2005   Per patient, TCS in [place] with diverticulosis and small polyps   COLONOSCOPY WITH PROPOFOL N/A 02/18/2020   Procedure: COLONOSCOPY WITH PROPOFOL;  Surgeon: Daneil Dolin, MD;  Location: [facility];  Service: Endoscopy;  Laterality: N/A;  7:30am   PAROTIDECTOMY     POLYPECTOMY  02/18/2020   Procedure: POLYPECTOMY;  Surgeon: Daneil Dolin, MD;  Location:  [facility];  Service: Endoscopy;;   SPINAL FUSION W/ LUQUE UNIT ROD  09/01/2020   TONSILLECTOMY     TOTAL KNEE ARTHROPLASTY Left 12/21/2021   Procedure: TOTAL KNEE ARTHROPLASTY;  Surgeon: Gaynelle Arabian, MD;  Location: [facility];  Service: Orthopedics;  Laterality: Left;     SOCIAL HISTORY:  Social History   Socioeconomic History   Marital status: Divorced    Spouse name: Not on file   Number of children: Not on file   Years of education:  Not on file   Highest education level: Not on file  Occupational History   Not on file  Tobacco Use   Smoking status: Never   Smokeless tobacco: Never  Vaping Use   Vaping Use: Never used  Substance and Sexual Activity   Alcohol use: Yes    Comment: rarely    Drug use: Never   Sexual activity: Not on file    Comment: CBD gummies  Other Topics Concern   Not on file  Social History Narrative   Not on file   Social Determinants of Health   Financial Resource Strain: Not on file  Food Insecurity: No Food Insecurity (04/09/2022)   Hunger Vital Sign    Worried About Running Out of Food in the Last Year: Never true    Ran Out of Food in the Last Year: Never true  Transportation Needs: No Transportation Needs (04/09/2022)   PRAPARE - Hydrologist (Medical): No    Lack of Transportation (Non-Medical): No  Physical Activity: Not on file  Stress: Not on file  Social Connections: Not on file  Intimate Partner Violence: Not At Risk (04/09/2022)   Humiliation, Afraid, Rape, and Kick questionnaire    Fear of Current or Ex-Partner: No    Emotionally Abused: No    Physically Abused: No    Sexually Abused: No    FAMILY HISTORY:  Family History  Problem Relation Age of Onset   Breast cancer Neg Hx    Colon cancer Neg Hx     CURRENT MEDICATIONS:  Outpatient Encounter Medications as of 04/26/2022  Medication Sig   acetaminophen (TYLENOL) 500 MG tablet Take 1,000 mg by mouth every 6 (six) hours as needed for moderate pain.   Alpha-Lipoic Acid 600 MG TABS Take 600 mg by mouth 2 (two) times daily.   ALPRAZolam (XANAX) 0.5 MG tablet Take 0.25-0.5 mg by mouth See admin instructions. Take 0.5 mg at bedtime, may take 0.25 to 0.5 mg once during the day as needed for anxiety   cetirizine (ZYRTEC) 10 MG tablet Take 10 mg by mouth at bedtime.   Cholecalciferol (VITAMIN D-3) 125 MCG (5000 UT) TABS Take 5,000 Units by mouth daily.   cyclobenzaprine (FLEXERIL) 10 MG  tablet    DULoxetine (CYMBALTA) 60 MG capsule Take 60 mg by mouth 2 (two) times daily.   EPIPEN 2-PAK 0.3 MG/0.3ML SOAJ injection Inject 0.3 mg into the muscle as needed for anaphylaxis ((bee stings)).    ezetimibe (ZETIA) 10 MG tablet Take 10 mg by mouth at bedtime.    gabapentin (NEURONTIN) 600 MG tablet Take 600 mg by mouth at bedtime.   Magnesium 100 MG TABS Take 100 mg by mouth daily.   methocarbamol (ROBAXIN) 500 MG tablet Take 1 tablet (500 mg total) by mouth every 6 (six) hours as needed for muscle spasms.   methylphenidate (RITALIN) 20 MG tablet Take 20 mg by mouth in the morning and at bedtime.  Omega-3 Fatty Acids (FISH OIL) 1000 MG CAPS Take 1,000 mg by mouth at bedtime.   Propylene Glycol (SYSTANE BALANCE) 0.6 % SOLN Place 1 drop into both eyes in the morning, at noon, and at bedtime.   RESTASIS 0.05 % ophthalmic emulsion Place 1 drop into both eyes 2 (two) times daily.    rosuvastatin (CRESTOR) 10 MG tablet Take 10 mg by mouth at bedtime.   Sodium Fluoride (CLINPRO 5000) 1.1 % PSTE Place 1 application onto teeth at bedtime.   tretinoin (RETIN-A) 0.1 % cream Apply topically at bedtime. (Patient taking differently: Apply 1 Application topically at bedtime.)   zinc gluconate 50 MG tablet Take 50 mg by mouth daily.   No facility-administered encounter medications on file as of 04/26/2022.    ALLERGIES:  Allergies  Allergen Reactions   Bee Venom Anaphylaxis    BEE STINGS   Sulfa Antibiotics Other (See Comments)    Blisters/ulcers.     PHYSICAL EXAM:  ECOG PERFORMANCE STATUS: 1 - Symptomatic but completely ambulatory  There were no vitals filed for this visit. There were no vitals filed for this visit. Physical Exam Constitutional:      Appearance: Normal appearance. She is obese.  HENT:     Head: Normocephalic and atraumatic.     Mouth/Throat:     Mouth: Mucous membranes are moist.  Eyes:     Extraocular Movements: Extraocular movements intact.     Pupils: Pupils  are equal, round, and reactive to light.  Cardiovascular:     Rate and Rhythm: Normal rate and regular rhythm.     Pulses: Normal pulses.     Heart sounds: Normal heart sounds.  Pulmonary:     Effort: Pulmonary effort is normal.     Breath sounds: Normal breath sounds.  Abdominal:     General: Bowel sounds are normal.     Palpations: Abdomen is soft.     Tenderness: There is no abdominal tenderness.  Musculoskeletal:        General: No swelling.     Right hand: Deformity present.     Left hand: Deformity present.     Right lower leg: No edema.     Left lower leg: No edema.     Comments: Heberden's and Bouchard's nodes noted on bilateral hands with swan-neck deformity   Lymphadenopathy:     Cervical: No cervical adenopathy.  Skin:    General: Skin is warm and dry.  Neurological:     General: No focal deficit present.     Mental Status: She is alert and oriented to person, place, and time.  Psychiatric:        Mood and Affect: Mood normal.        Behavior: Behavior normal.      LABORATORY DATA:  I have reviewed the labs as listed.  CBC    Component Value Date/Time   WBC 12.0 (H) 04/09/2022 0919   RBC 4.55 04/09/2022 0919   HGB 13.8 04/09/2022 0919   HCT 42.5 04/09/2022 0919   PLT 394 04/09/2022 0919   MCV 93.4 04/09/2022 0919   MCH 30.3 04/09/2022 0919   MCHC 32.5 04/09/2022 0919   RDW 13.3 04/09/2022 0919   LYMPHSABS 5.3 (H) 04/09/2022 0919   MONOABS 0.9 04/09/2022 0919   EOSABS 0.2 04/09/2022 0919   BASOSABS 0.0 04/09/2022 0919      Latest Ref Rng & Units 12/22/2021    3:18 AM 09/02/2020    6:43 AM 09/19/2019    7:41  PM  CMP  Glucose 70 - 99 mg/dL 125  139  178   BUN 8 - 23 mg/dL _0 Creatinine 0.44 - 1.00 mg/dL 0.48  0.57  0.78   Sodium 135 - 145 mmol/L 141  141  133   Potassium 3.5 - 5.1 mmol/L 3.7  4.3  3.3   Chloride 98 - 111 mmol/L 110  108  101   CO2 22 - 32 mmol/L _1 Calcium 8.9 - 10.3 mg/dL 8.8  8.9  8.9   Total Protein 6.5 -  8.1 g/dL   7.7   Total Bilirubin 0.3 - 1.2 mg/dL   0.6   Alkaline Phos 38 - 126 U/L   109   AST 15 - 41 U/L   45   ALT 0 - 44 U/L   41     DIAGNOSTIC IMAGING:  I have independently reviewed the relevant imaging and discussed with the patient.  ASSESSMENT & PLAN: 1.  Lymphocytosis - Patient self-reports elevated WBCs for the past 10+ years - Review of Epic shows elevated white count since at least April 2021 (no prior records or differentials available), ranging from WBC 12.0 to 17.8 - Labs sent by PCP (FNP Valentino Nose) significant for: (03/17/2022): WBC 11.4 (47% N, 46% L, 6% M, 1% E).  Normal Hgb and PLT. (03/03/2022): WBC 13.3, lymphocytes 7.0.  Normal Hgb and PLT. (07/10/2021): WBC 12.2 (41% N, 58% L, 7% M); lymphocytes 6.2.  Normal Hgb and PLT. - She is a lifelong non-smoker.   - She denies any history of splenectomy.   - She does not have any B symptoms or recurrent infections. - No recent steroid medications. - No known history of autoimmune/inflammatory connective tissue disorders, but she has been diagnosed with fibromyalgia and has had ongoing joint pain since her late 84s - Hematology workup (04/09/2022): Flow cytometry: Leukocytosis secondary to absolute lymphocytosis with predominantly large granular/atypical lymphocytes.  Flow cytometry shows no monoclonal B cells or immunophenotypically abnormal T cells.  There is an increase in T-LGL/NK cells also identified by flow cytometry (30% of lymphocytes CD3/CD56 positive), of uncertain clinical significance.  Can be secondary to viral/reactive component.  Would consider repeat flow cytometry in 6 months if lymphocytosis persists. Normal LDH 160. CBC/differential: WBC 12.0 with absolute lymphocytes 5.3, otherwise normal. - Per discussion with Dr. Delton Coombes, flow cytometry findings are nonspecific and do not indicate any malignant process at this time; findings could be secondary to occult autoimmune or inflammatory process;  recommends inflammatory workup and repeat flow cytometry in 6 months. - PLAN: Additional labs today (rheumatoid factor, ANA, CRP, ESR).  Will call patient with results. - Labs in 6 months (CBC/D, LDH, flow cytometry) with office visit 2 weeks after labs.  2.  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 - She was never smoker. - No family history of leukemias. - Paternal great aunt had breast cancer.   PLAN SUMMARY: >> Labs today (rheumatoid factor, ANA, CRP, ESR) >> Labs in 6 months (CBC/D, LDH, flow cytometry) >> Office visit in 6 months (2 weeks after labs)    All questions were answered. The patient knows to call the clinic with any problems, questions or concerns.  Medical decision making: Moderate  Time spent on visit: I spent 20 minutes counseling the patient face to face. The total time  spent in the appointment was 30 minutes and more than 50% was on counseling.   Harriett Rush, PA-C  04/26/2022 3:23 PM

## 2022-04-26 ENCOUNTER — Inpatient Hospital Stay: Payer: Medicare HMO | Admitting: Physician Assistant

## 2022-04-26 ENCOUNTER — Encounter: Payer: Self-pay | Admitting: Physician Assistant

## 2022-04-26 ENCOUNTER — Other Ambulatory Visit: Payer: Self-pay

## 2022-04-26 ENCOUNTER — Inpatient Hospital Stay: Payer: Medicare HMO

## 2022-04-26 VITALS — BP 161/81 | HR 72 | Temp 96.7°F | Resp 20 | Ht 58.66 in | Wt 184.7 lb

## 2022-04-26 DIAGNOSIS — D72820 Lymphocytosis (symptomatic): Secondary | ICD-10-CM | POA: Diagnosis not present

## 2022-04-26 DIAGNOSIS — M419 Scoliosis, unspecified: Secondary | ICD-10-CM | POA: Insufficient documentation

## 2022-04-26 LAB — SEDIMENTATION RATE: Sed Rate: 35 mm/hr — ABNORMAL HIGH (ref 0–22)

## 2022-04-26 LAB — C-REACTIVE PROTEIN: CRP: 0.6 mg/dL (ref <1.0)

## 2022-04-26 NOTE — Patient Instructions (Signed)
[facility] **   You were seen today by Tarri Abernethy PA-C for your elevated white blood cells (lymphocytes).   Your labs did not show any malignant (cancerous) reasons for elevated lymphocytes. You may have some underlying inflammation from arthritis or possible autoimmune disorder that could be causing elevated lymphocytes.   LABS: - Check labs TODAY to look at autoimmune and inflammatory markers. - Check labs again in 6 months (blood count and white blood cell test)  FOLLOW-UP APPOINTMENT: Office visit in 6 months, about 2 weeks after your labs  ** Thank you for trusting me with your healthcare!  I strive to provide all of my patients with quality care at each visit.  If you receive a survey for this visit, I would be so grateful to you for taking the time to provide feedback.  Thank you in advance!  ~ Bevin Mayall                   Dr. Derek Jack   &   Tarri Abernethy, PA-C   - - - - - - - - - - - - - - - - - -    Thank you for choosing [facility] to provide your oncology and hematology care.  To afford each patient quality time with our provider, please arrive at least 15 minutes before your scheduled appointment time.   If you have a lab appointment with the [facility] please come in thru the Main Entrance and check in at the main information desk.  You need to re-schedule your appointment should you arrive 10 or more minutes late.  We strive to give you quality time with our providers, and arriving late affects you and other patients whose appointments are after yours.  Also, if you no show three or more times for appointments you may be dismissed from the clinic at the providers discretion.     Again, thank you for choosing [facility].  Our hope is that these requests will decrease the amount of time that you wait before being seen by our  physicians.       _____________________________________________________________  Should you have questions after your visit to [facility], please contact our office at [phone] and follow the prompts.  Our office hours are 8:00 a.m. and 4:30 p.m. Monday - Friday.  Please note that voicemails left after 4:00 p.m. may not be returned until the following business day.  We are closed weekends and major holidays.  You do have access to a nurse 24-7, just call the main number to the clinic [phone] and do not press any options, hold on the line and a nurse will answer the phone.    For prescription refill requests, have your pharmacy contact our office and allow 72 hours.

## 2022-04-27 ENCOUNTER — Other Ambulatory Visit: Payer: Self-pay

## 2022-04-27 ENCOUNTER — Encounter: Payer: Self-pay | Admitting: Physician Assistant

## 2022-04-27 DIAGNOSIS — D72820 Lymphocytosis (symptomatic): Secondary | ICD-10-CM

## 2022-04-27 LAB — CBC WITH DIFFERENTIAL/PLATELET
Abs Immature Granulocytes: 0.02 10*3/uL (ref 0.00–0.07)
Basophils Absolute: 0.1 10*3/uL (ref 0.0–0.1)
Basophils Relative: 1 %
Eosinophils Absolute: 0.2 10*3/uL (ref 0.0–0.5)
Eosinophils Relative: 2 %
HCT: 42.3 % (ref 36.0–46.0)
Hemoglobin: 13.4 g/dL (ref 12.0–15.0)
Immature Granulocytes: 0 %
Lymphocytes Relative: 46 %
Lymphs Abs: 4.8 10*3/uL — ABNORMAL HIGH (ref 0.7–4.0)
MCH: 29.8 pg (ref 26.0–34.0)
MCHC: 31.7 g/dL (ref 30.0–36.0)
MCV: 94.2 fL (ref 80.0–100.0)
Monocytes Absolute: 0.7 10*3/uL (ref 0.1–1.0)
Monocytes Relative: 7 %
Neutro Abs: 4.5 10*3/uL (ref 1.7–7.7)
Neutrophils Relative %: 44 %
Platelets: 355 10*3/uL (ref 150–400)
RBC: 4.49 MIL/uL (ref 3.87–5.11)
RDW: 13.6 % (ref 11.5–15.5)
WBC: 10.2 10*3/uL (ref 4.0–10.5)
nRBC: 0 % (ref 0.0–0.2)

## 2022-04-27 LAB — LACTATE DEHYDROGENASE: LDH: 158 U/L (ref 98–192)

## 2022-04-27 LAB — RHEUMATOID FACTOR: Rheumatoid fact SerPl-aCnc: 16.4 IU/mL — ABNORMAL HIGH (ref <14.0)

## 2022-04-27 LAB — ANA: Anti Nuclear Antibody (ANA): POSITIVE — AB

## 2022-10-22 ENCOUNTER — Other Ambulatory Visit: Payer: Medicare HMO

## 2022-10-22 ENCOUNTER — Inpatient Hospital Stay: Payer: Medicare HMO | Attending: Physician Assistant

## 2022-10-29 ENCOUNTER — Ambulatory Visit: Payer: Medicare HMO | Admitting: Physician Assistant

## 2022-11-05 ENCOUNTER — Inpatient Hospital Stay: Payer: Medicare HMO | Admitting: Physician Assistant

## 2023-02-07 ENCOUNTER — Other Ambulatory Visit: Payer: Self-pay | Admitting: Gynecology

## 2023-02-07 DIAGNOSIS — Z1231 Encounter for screening mammogram for malignant neoplasm of breast: Secondary | ICD-10-CM

## 2023-03-04 ENCOUNTER — Ambulatory Visit
Admission: RE | Admit: 2023-03-04 | Discharge: 2023-03-04 | Disposition: A | Discharge status: Home / Self Care | Payer: Medicare HMO | Source: Ambulatory Visit | Attending: Gynecology | Admitting: Gynecology

## 2023-03-04 DIAGNOSIS — Z1231 Encounter for screening mammogram for malignant neoplasm of breast: Secondary | ICD-10-CM

## 2023-08-25 ENCOUNTER — Other Ambulatory Visit: Payer: Self-pay | Admitting: Neurosurgery

## 2023-08-25 DIAGNOSIS — R29818 Other symptoms and signs involving the nervous system: Secondary | ICD-10-CM

## 2023-08-30 ENCOUNTER — Encounter: Payer: Self-pay | Admitting: Neurosurgery

## 2023-09-12 ENCOUNTER — Encounter: Payer: Self-pay | Admitting: Neurosurgery

## 2023-09-13 ENCOUNTER — Ambulatory Visit
Admission: RE | Admit: 2023-09-13 | Discharge: 2023-09-13 | Disposition: A | Discharge status: Home / Self Care | Source: Ambulatory Visit | Attending: Neurosurgery | Admitting: Neurosurgery

## 2023-09-13 DIAGNOSIS — R29818 Other symptoms and signs involving the nervous system: Secondary | ICD-10-CM

## 2024-06-06 ENCOUNTER — Other Ambulatory Visit: Payer: Self-pay | Admitting: Gynecology

## 2024-06-06 DIAGNOSIS — Z1231 Encounter for screening mammogram for malignant neoplasm of breast: Secondary | ICD-10-CM

## 2024-06-08 ENCOUNTER — Ambulatory Visit
Admission: RE | Admit: 2024-06-08 | Discharge: 2024-06-08 | Disposition: A | Source: Ambulatory Visit | Attending: Gynecology | Admitting: Gynecology

## 2024-06-08 DIAGNOSIS — Z1231 Encounter for screening mammogram for malignant neoplasm of breast: Secondary | ICD-10-CM
